# Patient Record
Sex: Male | Born: 1975 | Race: Black or African American | Hispanic: No | Marital: Single | Smoking: Former smoker
Health system: Southern US, Community
[De-identification: ages and names within clinical notes are randomized; demographics above are authoritative.]

## PROBLEM LIST (undated history)

## (undated) DIAGNOSIS — F419 Anxiety disorder, unspecified: Secondary | ICD-10-CM

## (undated) DIAGNOSIS — E119 Type 2 diabetes mellitus without complications: Secondary | ICD-10-CM

## (undated) DIAGNOSIS — I10 Essential (primary) hypertension: Secondary | ICD-10-CM

## (undated) DIAGNOSIS — E785 Hyperlipidemia, unspecified: Secondary | ICD-10-CM

## (undated) DIAGNOSIS — T7840XA Allergy, unspecified, initial encounter: Secondary | ICD-10-CM

## (undated) DIAGNOSIS — M1990 Unspecified osteoarthritis, unspecified site: Secondary | ICD-10-CM

## (undated) DIAGNOSIS — J302 Other seasonal allergic rhinitis: Secondary | ICD-10-CM

## (undated) HISTORY — DX: Hyperlipidemia, unspecified: E78.5

## (undated) HISTORY — DX: Type 2 diabetes mellitus without complications: E11.9

## (undated) HISTORY — DX: Allergy, unspecified, initial encounter: T78.40XA

## (undated) HISTORY — DX: Essential (primary) hypertension: I10

## (undated) HISTORY — DX: Anxiety disorder, unspecified: F41.9

## (undated) HISTORY — PX: TESTICLE SURGERY: SHX794

---

## 1997-12-13 ENCOUNTER — Emergency Department (HOSPITAL_COMMUNITY): Admission: EM | Admit: 1997-12-13 | Discharge: 1997-12-13 | Payer: Self-pay | Admitting: Emergency Medicine

## 2003-05-18 ENCOUNTER — Emergency Department (HOSPITAL_COMMUNITY): Admission: AD | Admit: 2003-05-18 | Discharge: 2003-05-18 | Payer: Self-pay | Admitting: Family Medicine

## 2003-08-18 ENCOUNTER — Emergency Department (HOSPITAL_COMMUNITY): Admission: AD | Admit: 2003-08-18 | Discharge: 2003-08-18 | Payer: Self-pay | Admitting: Emergency Medicine

## 2004-07-31 ENCOUNTER — Encounter: Admission: RE | Admit: 2004-07-31 | Discharge: 2004-07-31 | Payer: Self-pay | Admitting: Nephrology

## 2006-05-12 ENCOUNTER — Encounter: Admission: RE | Admit: 2006-05-12 | Discharge: 2006-05-12 | Payer: Self-pay | Admitting: Nephrology

## 2006-10-17 ENCOUNTER — Emergency Department (HOSPITAL_COMMUNITY): Admission: EM | Admit: 2006-10-17 | Discharge: 2006-10-17 | Payer: Self-pay | Admitting: Emergency Medicine

## 2006-12-08 ENCOUNTER — Encounter: Admission: RE | Admit: 2006-12-08 | Discharge: 2006-12-08 | Payer: Self-pay | Admitting: Nephrology

## 2008-06-20 ENCOUNTER — Emergency Department (HOSPITAL_COMMUNITY): Admission: EM | Admit: 2008-06-20 | Discharge: 2008-06-20 | Payer: Self-pay | Admitting: Family Medicine

## 2012-02-19 ENCOUNTER — Emergency Department (INDEPENDENT_AMBULATORY_CARE_PROVIDER_SITE_OTHER)
Admission: EM | Admit: 2012-02-19 | Discharge: 2012-02-19 | Disposition: A | Discharge status: Home / Self Care | Payer: Medicare Other | Source: Home / Self Care | Attending: Emergency Medicine | Admitting: Emergency Medicine

## 2012-02-19 ENCOUNTER — Encounter (HOSPITAL_COMMUNITY): Payer: Self-pay

## 2012-02-19 DIAGNOSIS — R0789 Other chest pain: Secondary | ICD-10-CM

## 2012-02-19 DIAGNOSIS — M25539 Pain in unspecified wrist: Secondary | ICD-10-CM

## 2012-02-19 HISTORY — DX: Unspecified osteoarthritis, unspecified site: M19.90

## 2012-02-19 HISTORY — DX: Other seasonal allergic rhinitis: J30.2

## 2012-02-19 MED ORDER — NAPROXEN 500 MG PO TABS: 500.0000 mg | ORAL_TABLET | Freq: Two times a day (BID) | ORAL | Status: AC

## 2012-02-19 NOTE — ED Provider Notes (Signed)
History     CSN: [id number]  Arrival date & time 02/19/12  1404   First MD Initiated Contact with Patient 02/19/12 1440      Chief Complaint  Patient presents with  . Muscle Pain    (Consider location/radiation/quality/duration/timing/severity/associated sxs/prior treatment) HPI Comments: Patient presents with 2 complaints: First, patient reports right-sided anterior chest soreness, tightness for 3 days. No palpitations, diaphoresis, nausea, vomiting. No exertional or positional component. States it is worse with torso rotation, movement of his right arm. Denies any trauma to the chest, recent change in physical activity. No recent immobilization, prolonged trip, surgery, no hemoptysis, no exogenous estrogen, no history of DVT or PE  Second, patient reports 3 months of left wrist pain worse with wrist flexion/extension, aggravated with lifting heavy objects. No recent or remote history of trauma to his wrist. No erythema, swelling, distal paresthesias. He is right-handed. Patient states he quit smoking 2 years ago.  ROS as noted in HPI. All other ROS negative.   Patient is a 36 y.o. male presenting with chest pain and wrist pain. The history is provided by the patient. No language interpreter was used.  Chest Pain The chest pain began 3 - 5 days ago. Chest pain occurs intermittently. The chest pain is unchanged. The pain is associated with breathing and coughing. The quality of the pain is described as tightness. The pain does not radiate. Chest pain is worsened by certain positions. Pertinent negatives for primary symptoms include no fever, no fatigue, no syncope, no cough, no wheezing, no palpitations, no nausea, no vomiting and no altered mental status.  Pertinent negatives for associated symptoms include no lower extremity edema and no near-syncope. He tried nothing for the symptoms. Risk factors include male gender.  Pertinent negatives for past medical history include no arrhythmia,  no cancer, no COPD, no diabetes, no DVT, no hyperlipidemia, no hypertension, no MI, no PE, no PVD and no recent injury.  Pertinent negatives for family medical history include: no CAD in family, no early MI in family and no PE in family.    Wrist Pain This is a chronic problem. The current episode started more than 1 week ago. The problem occurs daily. The problem has not changed since onset.Exacerbated by: use. Nothing relieves the symptoms. He has tried nothing for the symptoms. The treatment provided no relief.    Past Medical History  Diagnosis Date  . Seasonal allergies   . Arthritis     History reviewed. No pertinent past surgical history.  Family History  Problem Relation Age of Onset  . Rashes / Skin problems Other     History  Substance Use Topics  . Smoking status: Former Games developer  . Smokeless tobacco: Not on file  . Alcohol Use: No      Review of Systems  Constitutional: Negative for fever and fatigue.  Respiratory: Negative for cough and wheezing.   Cardiovascular: Negative for palpitations, syncope and near-syncope.  Gastrointestinal: Negative for nausea and vomiting.  Psychiatric/Behavioral: Negative for altered mental status.    Allergies  Review of patient's allergies indicates no known allergies.  Home Medications   Current Outpatient Rx  Name Route Sig Dispense Refill  . ACETAMINOPHEN 500 MG PO TABS Oral Take 500 mg by mouth every 6 (six) hours as needed.    Marland Kitchen LORATADINE 10 MG PO TABS Oral Take 10 mg by mouth daily.    Marland Kitchen NAPROXEN 500 MG PO TABS Oral Take 1 tablet (500 mg total) by mouth 2 (two)  times daily. 20 tablet 0    BP 116/80  Pulse 78  Temp 98.5 F (36.9 C) (Oral)  Resp 18  SpO2 98%  Physical Exam  Nursing note and vitals reviewed. Constitutional: He is oriented to person, place, and time. He appears well-developed and well-nourished.  HENT:  Head: Normocephalic and atraumatic.  Eyes: Conjunctivae normal and EOM are normal.  Neck:  Normal range of motion.  Cardiovascular: Normal rate, regular rhythm, normal heart sounds and intact distal pulses.  Exam reveals no gallop and no friction rub.   No murmur heard. Pulmonary/Chest: Effort normal and breath sounds normal. He exhibits tenderness.       reproducible tenderness right upper chest, aggravated with rowing motion, torso rotation, forward flexion of arm against resistance. No rash, crepitus.  Abdominal: Soft. Bowel sounds are normal. There is no guarding.  Musculoskeletal: Normal range of motion. He exhibits no edema and no tenderness.       Left wrist: He exhibits tenderness. He exhibits normal range of motion, no bony tenderness, no swelling, no effusion, no crepitus and no deformity.       Left hand: Normal.       Calves symmetric. Tenderness along dorsum of the wrist. No erythema, swelling. Snuffbox nontender, carpals, metacarpals nontender. Finkelstein negative. Sensation and motor intact in median/radial/ulnar distribution. RP 2+  Neurological: He is alert and oriented to person, place, and time. Coordination normal.  Skin: Skin is warm and dry. No rash noted.  Psychiatric: He has a normal mood and affect. His behavior is normal. Judgment and thought content normal.    ED Course  Procedures (including critical care time)  Labs Reviewed - No data to display No results found.   1. Wrist pain   2. Musculoskeletal chest pain     MDM  EKG: Sinus arrhythmia, rate 75. Left axis deviation. Normal intervals. No hypertrophy. No ST T-wave changes. No previous EKG for comparison  Rhythm strip, normal sinus rhythm, rate 70.  Presentation is consistent with musculoskeletal chest pain. Pt PERC negative.  Vitals are normal, EKG without ischemic changes, lungs clear. Has reproducible chest wall tenderness, which is aggravated with arm movement, torso rotation. There is no rash, bruising, deformity. He is neurovascularly intact distally. Referring to cardiology for abnormal  EKG.  Also suspect repetitive injury wrist, no signs of fracture, infection. Deferring x-ray. Will place him in a wrist splint, home with ice, rest, elevation, NSAIDs, which also help with this chest pain. Referring to Dr. Melvyn Novas, hand surgeon on call, if no improvement with conservative treatment in 2 weeks.  Luiz Blare, MD 02/24/12 [id number]

## 2012-02-19 NOTE — ED Notes (Signed)
C/o pain in left wrist, worse w motion for past few months, and pain in right chest  (worse w cough , movement or palpation of chest wall ) NAD

## 2015-01-19 ENCOUNTER — Ambulatory Visit (INDEPENDENT_AMBULATORY_CARE_PROVIDER_SITE_OTHER): Payer: Medicare Other | Admitting: Podiatry

## 2015-01-19 ENCOUNTER — Encounter: Payer: Self-pay | Admitting: Podiatry

## 2015-01-19 VITALS — BP 150/97 | HR 89 | Temp 98.2°F | Resp 14

## 2015-01-19 DIAGNOSIS — M722 Plantar fascial fibromatosis: Secondary | ICD-10-CM | POA: Diagnosis not present

## 2015-01-19 NOTE — Progress Notes (Signed)
   Subjective:    Patient ID: John Mckenzie, male    DOB: 1975-07-11, 39 y.o.   MRN: [id number]  HPI Patient presents here for B/L arch pain, sometimes hurting when walking. This patient presents to the office stating he has lived with arch pain for over six months.  He says his pain is severe at times he needs to limp.  He points to the center of his arch both feet as the painful areas. He presents for evaluation and treatment. Review of Systems  Respiratory: Positive for cough.   Cardiovascular: Positive for chest pain.  Musculoskeletal: Positive for back pain.       Objective:   Physical Exam GENERAL APPEARANCE: Alert, conversant. Appropriately groomed. No acute distress.  VASCULAR: Pedal pulses palpable at  [facility] and PT bilateral.  Capillary refill time is immediate to all digits,  Normal temperature gradient.  Digital hair growth is present bilateral  NEUROLOGIC: sensation is normal to 5.07 monofilament at 5/5 sites bilateral.  Light touch is intact bilateral, Muscle strength normal.  MUSCULOSKELETAL: acceptable muscle strength, tone and stability bilateral.  Intrinsic muscluature intact bilateral.  Rectus appearance of foot and digits noted bilateral. Palpable pain mid-arch both feet.  Flexible pes planus both feet.  DERMATOLOGIC: skin color, texture, and turgor are within normal limits.  No preulcerative lesions or ulcers  are seen, no interdigital maceration noted.  No open lesions present.  Digital nails are asymptomatic. No drainage noted.        Assessment & Plan:  Plantar fasciitis B/L.  IE>  Dispense purestride insoles.

## 2015-03-15 ENCOUNTER — Encounter (HOSPITAL_COMMUNITY): Payer: Self-pay | Admitting: Neurology

## 2015-03-15 ENCOUNTER — Emergency Department (HOSPITAL_COMMUNITY)
Admission: EM | Admit: 2015-03-15 | Discharge: 2015-03-15 | Disposition: A | Discharge status: Home / Self Care | Payer: Medicare Other | Attending: Emergency Medicine | Admitting: Emergency Medicine

## 2015-03-15 DIAGNOSIS — E119 Type 2 diabetes mellitus without complications: Secondary | ICD-10-CM | POA: Insufficient documentation

## 2015-03-15 DIAGNOSIS — Z87891 Personal history of nicotine dependence: Secondary | ICD-10-CM | POA: Insufficient documentation

## 2015-03-15 DIAGNOSIS — M1990 Unspecified osteoarthritis, unspecified site: Secondary | ICD-10-CM | POA: Diagnosis not present

## 2015-03-15 DIAGNOSIS — Z79899 Other long term (current) drug therapy: Secondary | ICD-10-CM | POA: Diagnosis not present

## 2015-03-15 DIAGNOSIS — I10 Essential (primary) hypertension: Secondary | ICD-10-CM | POA: Diagnosis not present

## 2015-03-15 DIAGNOSIS — K625 Hemorrhage of anus and rectum: Secondary | ICD-10-CM | POA: Diagnosis not present

## 2015-03-15 LAB — CBC
HEMATOCRIT: 46.8 % (ref 39.0–52.0)
Hemoglobin: 15.7 g/dL (ref 13.0–17.0)
MCH: 27.3 pg (ref 26.0–34.0)
MCHC: 33.5 g/dL (ref 30.0–36.0)
MCV: 81.4 fL (ref 78.0–100.0)
Platelets: 284 10*3/uL (ref 150–400)
RBC: 5.75 MIL/uL (ref 4.22–5.81)
RDW: 13.1 % (ref 11.5–15.5)
WBC: 10.4 10*3/uL (ref 4.0–10.5)

## 2015-03-15 LAB — COMPREHENSIVE METABOLIC PANEL
ALBUMIN: 4.2 g/dL (ref 3.5–5.0)
ALK PHOS: 126 U/L (ref 38–126)
ALT: 88 U/L — ABNORMAL HIGH (ref 17–63)
AST: 67 U/L — AB (ref 15–41)
Anion gap: 11 (ref 5–15)
BILIRUBIN TOTAL: 0.7 mg/dL (ref 0.3–1.2)
BUN: 11 mg/dL (ref 6–20)
CALCIUM: 9.6 mg/dL (ref 8.9–10.3)
CO2: 24 mmol/L (ref 22–32)
Chloride: 100 mmol/L — ABNORMAL LOW (ref 101–111)
Creatinine, Ser: 0.93 mg/dL (ref 0.61–1.24)
GFR calc Af Amer: >60 mL/min (ref >60)
GFR calc non Af Amer: >60 mL/min (ref >60)
GLUCOSE: 144 mg/dL — AB (ref 65–99)
POTASSIUM: 3.9 mmol/L (ref 3.5–5.1)
SODIUM: 135 mmol/L (ref 135–145)
TOTAL PROTEIN: 7 g/dL (ref 6.5–8.1)

## 2015-03-15 LAB — POC OCCULT BLOOD, ED: Fecal Occult Bld: POSITIVE — AB

## 2015-03-15 LAB — TYPE AND SCREEN
ABO/RH(D): O POS
ANTIBODY SCREEN: NEGATIVE

## 2015-03-15 LAB — ABO/RH: ABO/RH(D): O POS

## 2015-03-15 NOTE — Discharge Instructions (Signed)
It was our pleasure to provide your ER care today - we hope that you feel better.  From today's labs, your blood work/blood count is normal.  For episodes of rectal bleeding, follow up closely with GI specialist in the next couple days - see referral - call office tomorrow morning to arrange appointment.  Return to ER if worse, new symptoms, fevers, severe abdominal pain, recurrent/heavy bleeding, weak/fainting, vomiting of blood, other concern.      Gastrointestinal Bleeding Gastrointestinal (GI) bleeding means there is bleeding somewhere along the digestive tract, between the mouth and anus. CAUSES  There are many different problems that can cause GI bleeding. Possible causes include:  Esophagitis. This is inflammation, irritation, or swelling of the esophagus.  Hemorrhoids.These are veins that are full of blood (engorged) in the rectum. They cause pain, inflammation, and may bleed.  Anal fissures.These are areas of painful tearing which may bleed. They are often caused by passing hard stool.  Diverticulosis.These are pouches that form on the colon over time, with age, and may bleed significantly.  Diverticulitis.This is inflammation in areas with diverticulosis. It can cause pain, fever, and bloody stools, although bleeding is rare.  Polyps and cancer. Colon cancer often starts out as precancerous polyps.  Gastritis and ulcers.Bleeding from the upper gastrointestinal tract (near the stomach) may travel through the intestines and produce black, sometimes tarry, often bad smelling stools. In certain cases, if the bleeding is fast enough, the stools may not be black, but red. This condition may be life-threatening. SYMPTOMS   Vomiting bright red blood or material that looks like coffee grounds.  Bloody, black, or tarry stools. DIAGNOSIS  Your caregiver may diagnose your condition by taking your history and performing a physical exam. More tests may be needed,  including:  X-rays and other imaging tests.  Esophagogastroduodenoscopy (EGD). This test uses a flexible, lighted tube to look at your esophagus, stomach, and small intestine.  Colonoscopy. This test uses a flexible, lighted tube to look at your colon. TREATMENT  Treatment depends on the cause of your bleeding.   For bleeding from the esophagus, stomach, small intestine, or colon, the caregiver doing your EGD or colonoscopy may be able to stop the bleeding as part of the procedure.  Inflammation or infection of the colon can be treated with medicines.  Many rectal problems can be treated with creams, suppositories, or warm baths.  Surgery is sometimes needed.  Blood transfusions are sometimes needed if you have lost a lot of blood. If bleeding is slow, you may be allowed to go home. If there is a lot of bleeding, you will need to stay in the hospital for observation. HOME CARE INSTRUCTIONS   Take any medicines exactly as prescribed.  Keep your stools soft by eating foods that are high in fiber. These foods include whole grains, legumes, fruits, and vegetables. Prunes (1 to 3 a day) work well for many people.  Drink enough fluids to keep your urine clear or pale yellow. SEEK IMMEDIATE MEDICAL CARE IF:   Your bleeding increases.  You feel lightheaded, weak, or you faint.  You have severe cramps in your back or abdomen.  You pass large blood clots in your stool.  Your problems are getting worse. MAKE SURE YOU:   Understand these instructions.  Will watch your condition.  Will get help right away if you are not doing well or get worse.   This information is not intended to replace advice given to you by your health  care provider. Make sure you discuss any questions you have with your health care provider.   Document Released: 05/24/2000 Document Revised: 05/13/2012 Document Reviewed: 11/14/2014 Elsevier Interactive Patient Education 2016 [employer]  Anal Fissure,  Adult An anal fissure is a small tear or crack in the skin around the anus. Bleeding from a fissure usually stops on its own within a few minutes. However, bleeding will often occur again with each bowel movement until the crack heals. CAUSES This condition may be caused by:  Passing large, hard stool (feces).  Frequent diarrhea.  Constipation.  Inflammatory bowel disease (Crohn disease or ulcerative colitis).  Infections.  Anal sex. SYMPTOMS Symptoms of this condition include:  Bleeding from the rectum.  Small amounts of blood seen on your stool, on toilet paper, or in the toilet after a bowel movement.  Painful bowel movements.  Itching or irritation around the anus. DIAGNOSIS A health care provider may diagnose this condition by closely examining the anal area. An anal fissure can usually be seen with careful inspection. In some cases, a rectal exam may be performed, or a short tube (anoscope) may be used to examine the anal canal. TREATMENT Treatment for this condition may include:  Taking steps to avoid constipation. This may include making changes to your diet, such as increasing your intake of fiber or fluid.  Taking fiber supplements. These supplements can soften your stool to help make bowel movements easier. Your health care provider may also prescribe a stool softener if your stool is often hard.  Taking sitz baths. This may help to heal the tear.  Using medicated creams or ointments. These may be prescribed to lessen discomfort. HOME CARE INSTRUCTIONS Eating and Drinking  Avoid foods that may be constipating, such as bananas and dairy products.  Drink enough fluid to keep your urine clear or pale yellow.  Maintain a diet that is high in fruits, whole grains, and vegetables. General Instructions  Keep the anal area as clean and dry as possible.  Take sitz baths as told by your health care provider. Do not use soap in the sitz baths.  Take  over-the-counter and prescription medicines only as told by your health care provider.  Use creams or ointments only as told by your health care provider.  Keep all follow-up visits as told by your health care provider. This is important. SEEK MEDICAL CARE IF:  You have more bleeding.  You have a fever.  You have diarrhea that is mixed with blood.  You continue to have pain.  Your problem is getting worse rather than better.   This information is not intended to replace advice given to you by your health care provider. Make sure you discuss any questions you have with your health care provider.   Document Released: 05/27/2005 Document Revised: 02/15/2015 Document Reviewed: 08/22/2014 Elsevier Interactive Patient Education [employer]

## 2015-03-15 NOTE — ED Provider Notes (Signed)
CSN: [id number]     Arrival date & time 03/15/15  1714 History   First MD Initiated Contact with Patient 03/15/15 2002     Chief Complaint  Patient presents with  . Rectal Bleeding     (Consider location/radiation/quality/duration/timing/severity/associated sxs/prior Treatment) Patient is a 39 y.o. male presenting with hematochezia. The history is provided by the patient.  Rectal Bleeding Associated symptoms: no abdominal pain, no epistaxis, no fever, no light-headedness and no vomiting   Patient c/o 3-4 episodes bright red per rectum.  States was reddish brown, watery. Denies straining or hard stools. No hx significant gib in past. No rectal pain. No hx hemorrhoids or fissure. No vomiting. No hx pud. No hx liver disease. No anticoag use. No etoh. No lightheadedness or faintness. No abd pain. No other abn bruising or bleeding.     Past Medical History  Diagnosis Date  . Seasonal allergies   . Arthritis   . Diabetes mellitus without complication (HCC)   . Hypertension    History reviewed. No pertinent past surgical history. Family History  Problem Relation Age of Onset  . Rashes / Skin problems Other    Social History  Substance Use Topics  . Smoking status: Former Games developer  . Smokeless tobacco: None  . Alcohol Use: No    Review of Systems  Constitutional: Negative for fever and chills.  HENT: Negative for nosebleeds.   Eyes: Negative for redness.  Respiratory: Negative for shortness of breath.   Cardiovascular: Negative for chest pain.  Gastrointestinal: Positive for blood in stool and hematochezia. Negative for vomiting and abdominal pain.  Genitourinary: Negative for hematuria.  Musculoskeletal: Negative for back pain and neck pain.  Skin: Negative for rash.  Neurological: Negative for syncope, light-headedness and headaches.  Hematological: Does not bruise/bleed easily.  Psychiatric/Behavioral: Negative for confusion.      Allergies  Review of patient's  allergies indicates no known allergies.  Home Medications   Prior to Admission medications   Medication Sig Start Date End Date Taking? Authorizing Provider  acetaminophen (TYLENOL) 500 MG tablet Take 500 mg by mouth every 6 (six) hours as needed for mild pain or headache.    Yes Historical Provider, MD  atorvastatin (LIPITOR) 10 MG tablet Take 10 mg by mouth daily. 01/17/15  Yes Historical Provider, MD  INVOKANA 100 MG TABS tablet Take 100 mg by mouth daily. 01/17/15  Yes Historical Provider, MD  loratadine (CLARITIN) 10 MG tablet Take 10 mg by mouth daily.   Yes Historical Provider, MD  metFORMIN (GLUCOPHAGE) 1000 MG tablet 1 TABLET BY MOUTH TWICE A DAY 11/25/14  Yes Historical Provider, MD  omeprazole (PRILOSEC) 20 MG capsule Take 40 mg by mouth daily. 01/17/15  Yes Historical Provider, MD   BP 132/89 mmHg  Pulse 94  Temp(Src) 98.9 F (37.2 C) (Oral)  Resp 16  SpO2 99% Physical Exam  Constitutional: He is oriented to person, place, and time. He appears well-developed and well-nourished. No distress.  HENT:  Head: Atraumatic.  Eyes: Conjunctivae are normal. No scleral icterus.  Neck: Neck supple. No tracheal deviation present.  Cardiovascular: Normal rate, regular rhythm, normal heart sounds and intact distal pulses.   Pulmonary/Chest: Effort normal and breath sounds normal. No accessory muscle usage. No respiratory distress.  Abdominal: Soft. Bowel sounds are normal. He exhibits no distension. There is no tenderness. There is no rebound and no guarding.  Genitourinary:  No formed stool on rectal, scant brownish mucous, tests heme pos.   Musculoskeletal: Normal range of  motion. He exhibits no edema or tenderness.  Neurological: He is alert and oriented to person, place, and time.  Skin: Skin is warm and dry. No rash noted.  Psychiatric: He has a normal mood and affect.  Nursing note and vitals reviewed.   ED Course  Procedures (including critical care time) Labs Review  Results  for orders placed or performed during the hospital encounter of 03/15/15  Comprehensive metabolic panel  Result Value Ref Range   Sodium 135 135 - 145 mmol/L   Potassium 3.9 3.5 - 5.1 mmol/L   Chloride 100 (L) 101 - 111 mmol/L   CO2 24 22 - 32 mmol/L   Glucose, Bld 144 (H) 65 - 99 mg/dL   BUN 11 6 - 20 mg/dL   Creatinine, Ser 4.09 0.61 - 1.24 mg/dL   Calcium 9.6 8.9 - 81.1 mg/dL   Total Protein 7.0 6.5 - 8.1 g/dL   Albumin 4.2 3.5 - 5.0 g/dL   AST 67 (H) 15 - 41 U/L   ALT 88 (H) 17 - 63 U/L   Alkaline Phosphatase 126 38 - 126 U/L   Total Bilirubin 0.7 0.3 - 1.2 mg/dL   GFR calc non Af Amer >60 >60 mL/min   GFR calc Af Amer >60 >60 mL/min   Anion gap 11 5 - 15  CBC  Result Value Ref Range   WBC 10.4 4.0 - 10.5 K/uL   RBC 5.75 4.22 - 5.81 MIL/uL   Hemoglobin 15.7 13.0 - 17.0 g/dL   HCT 91.4 78.2 - 95.6 %   MCV 81.4 78.0 - 100.0 fL   MCH 27.3 26.0 - 34.0 pg   MCHC 33.5 30.0 - 36.0 g/dL   RDW 21.3 08.6 - 57.8 %   Platelets 284 150 - 400 K/uL  Type and screen  Result Value Ref Range   ABO/RH(D) O POS    Antibody Screen NEG    Sample Expiration 03/18/2015   ABO/Rh  Result Value Ref Range   ABO/RH(D) O POS       I have personally reviewed and evaluated these lab results as part of my medical decision-making.    MDM   Labs.  Reviewed nursing notes and prior charts for additional history.   No faintness or dizziness. Vitals normal. Hgb/bun normal.  Discussed diff dx w pt.  Pt had picture of home bms, appearred to have small amt brownish-reddish liquid in toilet bowl full of water. No melena.  Given reassuring labs and exam, feel pt currently stable for d/c.   Close gi f/u.       Cathren Laine, MD 03/15/15 2049

## 2015-03-15 NOTE — ED Notes (Signed)
Pt reports rectal bleeding x 4 today when having a BM. Blood is bright red, this has happened once in past but went away on its own. Denies blood thinners. Pt is a x 4.

## 2015-09-21 ENCOUNTER — Encounter (HOSPITAL_COMMUNITY): Payer: Self-pay

## 2015-09-21 ENCOUNTER — Emergency Department (HOSPITAL_COMMUNITY)
Admission: EM | Admit: 2015-09-21 | Discharge: 2015-09-21 | Disposition: A | Discharge status: Home / Self Care | Payer: Medicare Other | Attending: Emergency Medicine | Admitting: Emergency Medicine

## 2015-09-21 DIAGNOSIS — R739 Hyperglycemia, unspecified: Secondary | ICD-10-CM

## 2015-09-21 DIAGNOSIS — Z87891 Personal history of nicotine dependence: Secondary | ICD-10-CM | POA: Diagnosis not present

## 2015-09-21 DIAGNOSIS — M1990 Unspecified osteoarthritis, unspecified site: Secondary | ICD-10-CM | POA: Diagnosis not present

## 2015-09-21 DIAGNOSIS — R630 Anorexia: Secondary | ICD-10-CM | POA: Insufficient documentation

## 2015-09-21 DIAGNOSIS — I10 Essential (primary) hypertension: Secondary | ICD-10-CM | POA: Insufficient documentation

## 2015-09-21 DIAGNOSIS — Z79899 Other long term (current) drug therapy: Secondary | ICD-10-CM | POA: Insufficient documentation

## 2015-09-21 DIAGNOSIS — Z7984 Long term (current) use of oral hypoglycemic drugs: Secondary | ICD-10-CM | POA: Diagnosis not present

## 2015-09-21 DIAGNOSIS — E1165 Type 2 diabetes mellitus with hyperglycemia: Secondary | ICD-10-CM | POA: Insufficient documentation

## 2015-09-21 LAB — BASIC METABOLIC PANEL
Anion gap: 13 (ref 5–15)
BUN: 8 mg/dL (ref 6–20)
CALCIUM: 9 mg/dL (ref 8.9–10.3)
CO2: 19 mmol/L — AB (ref 22–32)
Chloride: 107 mmol/L (ref 101–111)
Creatinine, Ser: 0.87 mg/dL (ref 0.61–1.24)
GFR calc Af Amer: >60 mL/min (ref >60)
GLUCOSE: 372 mg/dL — AB (ref 65–99)
POTASSIUM: 4.6 mmol/L (ref 3.5–5.1)
Sodium: 139 mmol/L (ref 135–145)

## 2015-09-21 LAB — URINALYSIS, ROUTINE W REFLEX MICROSCOPIC
Bilirubin Urine: NEGATIVE
Hgb urine dipstick: NEGATIVE
KETONES UR: NEGATIVE mg/dL
LEUKOCYTES UA: NEGATIVE
Nitrite: NEGATIVE
PROTEIN: NEGATIVE mg/dL
Specific Gravity, Urine: 1.038 — ABNORMAL HIGH (ref 1.005–1.030)
pH: 6 (ref 5.0–8.0)

## 2015-09-21 LAB — I-STAT TROPONIN, ED: TROPONIN I, POC: 0 ng/mL (ref 0.00–0.08)

## 2015-09-21 LAB — CBC
HCT: 45.3 % (ref 39.0–52.0)
Hemoglobin: 15.2 g/dL (ref 13.0–17.0)
MCH: 26.8 pg (ref 26.0–34.0)
MCHC: 33.6 g/dL (ref 30.0–36.0)
MCV: 79.8 fL (ref 78.0–100.0)
PLATELETS: 244 10*3/uL (ref 150–400)
RBC: 5.68 MIL/uL (ref 4.22–5.81)
RDW: 13.9 % (ref 11.5–15.5)
WBC: 8.6 10*3/uL (ref 4.0–10.5)

## 2015-09-21 LAB — CBG MONITORING, ED
GLUCOSE-CAPILLARY: 360 mg/dL — AB (ref 65–99)
Glucose-Capillary: 222 mg/dL — ABNORMAL HIGH (ref 65–99)

## 2015-09-21 LAB — URINE MICROSCOPIC-ADD ON
Bacteria, UA: NONE SEEN
RBC / HPF: NONE SEEN RBC/hpf (ref 0–5)

## 2015-09-21 MED ORDER — ONDANSETRON HCL 4 MG PO TABS: 4.0000 mg | ORAL_TABLET | Freq: Four times a day (QID) | ORAL | Status: DC

## 2015-09-21 MED ORDER — ONDANSETRON HCL 4 MG/2ML IJ SOLN
4.0000 mg | Freq: Once | INTRAMUSCULAR | Status: AC
  Administered 2015-09-21: 4 mg via INTRAVENOUS
  Filled 2015-09-21: qty 2

## 2015-09-21 MED ORDER — SODIUM CHLORIDE 0.9 % IV BOLUS (SEPSIS)
1000.0000 mL | Freq: Once | INTRAVENOUS | Status: AC
  Administered 2015-09-21: 1000 mL via INTRAVENOUS

## 2015-09-21 NOTE — ED Notes (Signed)
Pt states understanding of all discharge orders.

## 2015-09-21 NOTE — ED Provider Notes (Signed)
CSN: [id number]     Arrival date & time 09/21/15  1415 History   First MD Initiated Contact with Patient 09/21/15 1930     Chief Complaint  Patient presents with  . Hyperglycemia     (Consider location/radiation/quality/duration/timing/severity/associated sxs/prior Treatment) Patient is a 40 y.o. male presenting with hyperglycemia. The history is provided by the patient.  Hyperglycemia Blood sugar level PTA:  >400 Severity:  Severe Onset quality:  Gradual Duration:  1 day Timing:  Constant Progression:  Waxing and waning Chronicity:  Chronic Diabetes status:  Controlled with oral medications Current diabetic therapy:  Metformin Time since last antidiabetic medication: this AM. Context: recent change in diet (poor dietary choices)   Context: not change in medication, not insulin pump use, not new diabetes diagnosis, not noncompliance and not recent illness   Relieved by:  None tried Ineffective treatments:  Diet and oral agents Associated symptoms: increased thirst, nausea and polyuria   Associated symptoms: no abdominal pain, no altered mental status, no chest pain, no confusion, no diaphoresis, no dizziness, no dysuria, no fatigue, no fever, no shortness of breath, no syncope, no vomiting and no weakness   Nausea:    Severity:  Mild   Onset quality:  Gradual   Progression:  Waxing and waning Risk factors: obesity   Risk factors: no hx of DKA     Past Medical History  Diagnosis Date  . Seasonal allergies   . Arthritis   . Diabetes mellitus without complication (HCC)   . Hypertension    History reviewed. No pertinent past surgical history. Family History  Problem Relation Age of Onset  . Rashes / Skin problems Other    Social History  Substance Use Topics  . Smoking status: Former Games developermoker  . Smokeless tobacco: None  . Alcohol Use: No    Review of Systems  Constitutional: Positive for activity change and appetite change. Negative for fever, diaphoresis and  fatigue.  HENT: Negative for congestion, rhinorrhea, sinus pressure, sneezing and sore throat.   Respiratory: Negative for chest tightness and shortness of breath.   Cardiovascular: Negative for chest pain and syncope.  Gastrointestinal: Positive for nausea. Negative for vomiting, abdominal pain and diarrhea.  Endocrine: Positive for polydipsia and polyuria.  Genitourinary: Positive for frequency. Negative for dysuria.  Musculoskeletal: Negative for myalgias, back pain and neck pain.  Skin: Negative for rash and wound.  Neurological: Negative for dizziness, syncope, weakness and headaches.  Psychiatric/Behavioral: Negative for confusion.  All other systems reviewed and are negative.     Allergies  Review of patient's allergies indicates no known allergies.  Home Medications   Prior to Admission medications   Medication Sig Start Date End Date Taking? Authorizing Provider  acetaminophen (TYLENOL) 500 MG tablet Take 500 mg by mouth every 6 (six) hours as needed for mild pain or headache.    Yes Historical Provider, MD  cyclobenzaprine (FLEXERIL) 10 MG tablet Take 10 mg by mouth 3 (three) times daily as needed for muscle spasms.   Yes Historical Provider, MD  loratadine (CLARITIN) 10 MG tablet Take 10 mg by mouth daily.   Yes Historical Provider, MD  meloxicam (MOBIC) 15 MG tablet Take 15 mg by mouth daily.   Yes Historical Provider, MD  metFORMIN (GLUCOPHAGE) 1000 MG tablet 1 TABLET BY MOUTH TWICE A DAY 11/25/14  Yes Historical Provider, MD  omeprazole (PRILOSEC) 20 MG capsule Take 40 mg by mouth daily. 01/17/15  Yes Historical Provider, MD  ondansetron (ZOFRAN) 4 MG tablet Take 1  tablet (4 mg total) by mouth every 6 (six) hours. 09/21/15   Francoise Ceo, DO   BP 98/57 mmHg  Pulse 76  Temp(Src) 98 F (36.7 C) (Oral)  Resp 16  SpO2 100% Physical Exam  Constitutional: He is oriented to person, place, and time. He appears well-developed and well-nourished. No distress.  HENT:  Head:  Normocephalic and atraumatic.  Right Ear: External ear normal.  Left Ear: External ear normal.  Nose: Nose normal.  Mouth/Throat: Oropharynx is clear and moist.  Eyes: Conjunctivae are normal. Pupils are equal, round, and reactive to light.  Neck: Neck supple.  Cardiovascular: Normal rate, regular rhythm, normal heart sounds and intact distal pulses.   Pulmonary/Chest: Effort normal and breath sounds normal.  Abdominal: Soft. He exhibits no distension. There is no tenderness.  Musculoskeletal: He exhibits no edema or tenderness.  Neurological: He is alert and oriented to person, place, and time. No cranial nerve deficit. Coordination normal.  Skin: Skin is warm and dry. No rash noted. He is not diaphoretic.  Nursing note and vitals reviewed.   ED Course  Procedures (including critical care time) Labs Review Labs Reviewed  BASIC METABOLIC PANEL - Abnormal; Notable for the following:    CO2 19 (*)    Glucose, Bld 372 (*)    All other components within normal limits  URINALYSIS, ROUTINE W REFLEX MICROSCOPIC (NOT AT [facility]) - Abnormal; Notable for the following:    Specific Gravity, Urine 1.038 (*)    Glucose, UA >1000 (*)    All other components within normal limits  URINE MICROSCOPIC-ADD ON - Abnormal; Notable for the following:    Squamous Epithelial / LPF 0-5 (*)    All other components within normal limits  CBG MONITORING, ED - Abnormal; Notable for the following:    Glucose-Capillary 360 (*)    All other components within normal limits  CBG MONITORING, ED - Abnormal; Notable for the following:    Glucose-Capillary 222 (*)    All other components within normal limits  CBC  I-STAT TROPOININ, ED    Imaging Review No results found. I have personally reviewed and evaluated these images and lab results as part of my medical decision-making.   EKG Interpretation None      MDM  40 y.o. male with a history of type 2 diabetes on metformin presents to the emergency department  noting recent polyuria or polydipsia and mild nausea and checked his glucose this afternoon and found it to be greater than 400. Physical exam, as above vital signs stable sitting in bed in no acute distress. Labs are drawn and returned very reassuringly with no leukocytosis nor anemia, mildly decreased bicarbonate at 19 but normal anion gap. Initial blood glucose was 360 and upon arrival to the room, found to be 222 with no other intervention.  EKG was done and showed NSR with no acute ST or t-wave abnormalities suggestive of ischemia. Troponin negative. He was given IV fluids and Zofran and had improvement in his symptoms. Feel that his symptoms are due to hyperglycemia, and I have low suspicion for infectious or ischemic etiology of his symptoms. Feel it has hyperglycemia is likely due to dietary noncompliance. He was strongly recommended to abide by a healthy diabetic diet and was recommended to follow up with his PCP within the next 3 days to further discuss his medication regimen. He was prescribed a few Zofran ODT for further symptomatic relief, as needed. This plan was discussed with the patient at the  bedside and he stated both understanding and agreement.   Final diagnoses:  Hyperglycemia      Francoise Ceo, DO 09/22/15 0134  Doug Sou, MD 09/23/15 [phone]

## 2015-09-21 NOTE — ED Provider Notes (Signed)
Complains of polyuria and polydipsia for 1 week.  today he has had nausea since approximate 10 AM he denies any chest pain denies fever denies shortness of breath.he does complain of generalized weakness. Feels dehydrated. On exam alert no distress mucous members are dry   Doug SouSam Selig [place], MD 09/22/15 0003

## 2015-09-21 NOTE — ED Notes (Signed)
Patient here with blood sugar running high, reports that it was greater than 400 at drug store. Has been taking metformin as prescribed. Reports frequent urination. No distress

## 2016-01-19 ENCOUNTER — Encounter: Payer: Medicare Other | Attending: Family | Admitting: Skilled Nursing Facility1

## 2016-01-19 ENCOUNTER — Encounter: Payer: Self-pay | Admitting: Skilled Nursing Facility1

## 2016-01-19 DIAGNOSIS — E119 Type 2 diabetes mellitus without complications: Secondary | ICD-10-CM | POA: Diagnosis not present

## 2016-01-19 DIAGNOSIS — E782 Mixed hyperlipidemia: Secondary | ICD-10-CM | POA: Diagnosis not present

## 2016-01-19 DIAGNOSIS — Z713 Dietary counseling and surveillance: Secondary | ICD-10-CM | POA: Diagnosis not present

## 2016-01-19 NOTE — Progress Notes (Signed)
Diabetes Self-Management Education  Visit Type: First/Initial  Appt. Start Time: 9:30 Appt. End Time: 11:00  01/19/2016  John Mckenzie, identified by name and date of birth, is a 40 y.o. male with a diagnosis of Diabetes: Type 2.   ASSESSMENT  Height  (1.803 m), weight 208 lb (94.3 kg). Body mass index is 29.01 kg/m. Pt accompanied by his mother and wife. Pt seemed to have some mental slowness but was able to understand the concepts educated on.      Diabetes Self-Management Education - 01/19/16 0924      Visit Information   Visit Type First/Initial     Initial Visit   Diabetes Type Type 2   Are you currently following a meal plan? No   Are you taking your medications as prescribed? No   Date Diagnosed 2015     Health Coping   How would you rate your overall health? Fair     Psychosocial Assessment   Patient Belief/Attitude about Diabetes Motivated to manage diabetes   Self-management support Family   Other persons present Spouse/SO;Family Member   Patient Concerns Nutrition/Meal planning;Glycemic Control;Weight Control   Special Needs Simplified materials     Pre-Education Assessment   Patient understands the diabetes disease and treatment process. Needs Instruction   Patient understands incorporating nutritional management into lifestyle. Needs Instruction   Patient undertands incorporating physical activity into lifestyle. Needs Instruction   Patient understands using medications safely. Needs Instruction   Patient understands monitoring blood glucose, interpreting and using results Needs Instruction   Patient understands prevention, detection, and treatment of acute complications. Needs Instruction   Patient understands prevention, detection, and treatment of chronic complications. Needs Instruction   Patient understands how to develop strategies to address psychosocial issues. Needs Instruction   Patient understands how to develop strategies to  promote health/change behavior. Needs Instruction     Complications   Last HgB A1C per patient/outside source 10.5 %   How often do you check your blood sugar? --  a few times a week   Fasting Blood glucose range (mg/dL) 16-109   Postprandial Blood glucose range (mg/dL) 60-454   Have you had a dilated eye exam in the past 12 months? Yes   Have you had a dental exam in the past 12 months? Yes   Are you checking your feet? No   How many days per week are you checking your feet? 5     Dietary Intake   Breakfast cereal----grits   Lunch sandwich   Dinner shake and bake chicken, vegetables   Beverage(s) milk, water, soda     Exercise   Exercise Type Light (walking / raking leaves)   How many days per week to you exercise? 5   How many minutes per day do you exercise? 30   Total minutes per week of exercise 150     Patient Education   Previous Diabetes Education No   Disease state  Definition of diabetes, type 1 and 2, and the diagnosis of diabetes;Factors that contribute to the development of diabetes   Nutrition management  Role of diet in the treatment of diabetes and the relationship between the three main macronutrients and blood glucose level;Food label reading, portion sizes and measuring food.;Carbohydrate counting;Reviewed blood glucose goals for pre and post meals and how to evaluate the patients' food intake on their blood glucose level.   Physical activity and exercise  Role of exercise on diabetes management, blood pressure control and cardiac health.;Helped patient  identify appropriate exercises in relation to his/her diabetes, diabetes complications and other health issue.;Identified with patient nutritional and/or medication changes necessary with exercise.   Monitoring Purpose and frequency of SMBG.;Yearly dilated eye exam;Daily foot exams;Taught/evaluated SMBG meter.;Taught/discussed recording of test results and interpretation of SMBG.   Acute complications Taught treatment  of hypoglycemia - the 15 rule.;Discussed and identified patients' treatment of hyperglycemia.   Chronic complications Dental care;Assessed and discussed foot care and prevention of foot problems   Psychosocial adjustment Role of stress on diabetes     Individualized Goals (developed by patient)   Nutrition Follow meal plan discussed;Adjust meds/carbs with exercise as discussed   Physical Activity Exercise 3-5 times per week;60 minutes per day   Medications take my medication as prescribed   Monitoring  test blood glucose pre and post meals as discussed;test my blood glucose as discussed     Post-Education Assessment   Patient understands the diabetes disease and treatment process. Demonstrates understanding / competency   Patient understands incorporating nutritional management into lifestyle. Demonstrates understanding / competency   Patient undertands incorporating physical activity into lifestyle. Demonstrates understanding / competency   Patient understands using medications safely. Demonstrates understanding / competency   Patient understands monitoring blood glucose, interpreting and using results Demonstrates understanding / competency   Patient understands prevention, detection, and treatment of acute complications. Demonstrates understanding / competency   Patient understands prevention, detection, and treatment of chronic complications. Demonstrates understanding / competency   Patient understands how to develop strategies to address psychosocial issues. Demonstrates understanding / competency   Patient understands how to develop strategies to promote health/change behavior. Demonstrates understanding / competency     Outcomes   Expected Outcomes Demonstrated interest in learning. Expect positive outcomes   Future DMSE PRN   Program Status Completed      Individualized Plan for Diabetes Self-Management Training:   Learning Objective:  Patient will have a greater understanding  of diabetes self-management. Patient education plan is to attend individual and/or group sessions per assessed needs and concerns.   Plan:   Patient Instructions  -Look for 0%, 1%, fat free, or skim milk -Always bring your meter with you everywhere you go -Always Properly dispose of your needles:  -Discard in a hard plastic/metal container with a lid (something the needle can't puncture)  -Write Do Not Recycle on the outside of the container  -Example: A laundry detergent bottle -Never use the same needle more than once -Eat 2-3 carbohydrate choices for each meal and 1 for each snack -A meal: carbohydrates, protein, vegetable -A snack: A Fruit OR Vegetable AND Protein  -Always pay attention to your body keeping watchful of possible low blood sugar (below 70) or high blood sugar (above 200)  -check your feet every day -Start walking at the gym 3 days a week for     Expected Outcomes:  Demonstrated interest in learning. Expect positive outcomes  Education material provided: Living Well with Diabetes, Meal plan card, My Plate and Snack sheet  If problems or questions, patient to contact team via:  Phone  Future DSME appointment: PRN

## 2016-01-19 NOTE — Patient Instructions (Addendum)
-  Look for 0%, 1%, fat free, or skim milk -Always bring your meter with you everywhere you go -Always Properly dispose of your needles:  -Discard in a hard plastic/metal container with a lid (something the needle can't puncture)  -Write Do Not Recycle on the outside of the container  -Example: A laundry detergent bottle -Never use the same needle more than once -Eat 2-3 carbohydrate choices for each meal and 1 for each snack -A meal: carbohydrates, protein, vegetable -A snack: A Fruit OR Vegetable AND Protein  -Always pay attention to your body keeping watchful of possible low blood sugar (below 70) or high blood sugar (above 200)  -check your feet every day -Start walking at the gym 3 days a week for

## 2016-01-26 ENCOUNTER — Encounter: Payer: Self-pay | Admitting: Skilled Nursing Facility1

## 2016-02-29 ENCOUNTER — Encounter (HOSPITAL_COMMUNITY): Payer: Self-pay | Admitting: *Deleted

## 2016-02-29 ENCOUNTER — Emergency Department (HOSPITAL_COMMUNITY): Payer: Medicare Other

## 2016-02-29 DIAGNOSIS — Z7984 Long term (current) use of oral hypoglycemic drugs: Secondary | ICD-10-CM | POA: Insufficient documentation

## 2016-02-29 DIAGNOSIS — E119 Type 2 diabetes mellitus without complications: Secondary | ICD-10-CM | POA: Insufficient documentation

## 2016-02-29 DIAGNOSIS — Z79899 Other long term (current) drug therapy: Secondary | ICD-10-CM | POA: Insufficient documentation

## 2016-02-29 DIAGNOSIS — I10 Essential (primary) hypertension: Secondary | ICD-10-CM | POA: Diagnosis not present

## 2016-02-29 DIAGNOSIS — Z87891 Personal history of nicotine dependence: Secondary | ICD-10-CM | POA: Insufficient documentation

## 2016-02-29 DIAGNOSIS — M79602 Pain in left arm: Secondary | ICD-10-CM | POA: Diagnosis present

## 2016-02-29 LAB — BASIC METABOLIC PANEL
Anion gap: 8 (ref 5–15)
BUN: 8 mg/dL (ref 6–20)
CHLORIDE: 103 mmol/L (ref 101–111)
CO2: 26 mmol/L (ref 22–32)
Calcium: 9.7 mg/dL (ref 8.9–10.3)
Creatinine, Ser: 1.01 mg/dL (ref 0.61–1.24)
Glucose, Bld: 167 mg/dL — ABNORMAL HIGH (ref 65–99)
POTASSIUM: 3.9 mmol/L (ref 3.5–5.1)
SODIUM: 137 mmol/L (ref 135–145)

## 2016-02-29 LAB — CBC
HCT: 49 % (ref 39.0–52.0)
Hemoglobin: 15.9 g/dL (ref 13.0–17.0)
MCH: 25.6 pg — ABNORMAL LOW (ref 26.0–34.0)
MCHC: 32.4 g/dL (ref 30.0–36.0)
MCV: 78.9 fL (ref 78.0–100.0)
PLATELETS: 259 10*3/uL (ref 150–400)
RBC: 6.21 MIL/uL — AB (ref 4.22–5.81)
RDW: 14.2 % (ref 11.5–15.5)
WBC: 10.4 10*3/uL (ref 4.0–10.5)

## 2016-02-29 LAB — I-STAT TROPONIN, ED: TROPONIN I, POC: 0 ng/mL (ref 0.00–0.08)

## 2016-02-29 NOTE — ED Triage Notes (Signed)
Pt c/o L arm pain x 1 month. Pain intermittently radiates into L chest. PCP sent pt to ED for further eval

## 2016-03-01 ENCOUNTER — Emergency Department (HOSPITAL_COMMUNITY)
Admission: EM | Admit: 2016-03-01 | Discharge: 2016-03-01 | Disposition: A | Discharge status: Home / Self Care | Payer: Medicare Other | Attending: Emergency Medicine | Admitting: Emergency Medicine

## 2016-03-01 DIAGNOSIS — M79602 Pain in left arm: Secondary | ICD-10-CM | POA: Diagnosis not present

## 2016-03-01 MED ORDER — LIDOCAINE 5 % EX PTCH
1.0000 | MEDICATED_PATCH | Freq: Once | CUTANEOUS | Status: DC
  Administered 2016-03-01: 1 via TRANSDERMAL
  Filled 2016-03-01: qty 1

## 2016-03-01 MED ORDER — ACETAMINOPHEN 500 MG PO TABS
1000.0000 mg | ORAL_TABLET | Freq: Once | ORAL | Status: AC
  Administered 2016-03-01: 1000 mg via ORAL
  Filled 2016-03-01: qty 2

## 2016-03-01 NOTE — ED Provider Notes (Signed)
[facility]-EMERGENCY DEPT Provider Note   CSN: [id number] Arrival date & time: 02/29/16  2047  By signing my name below, I, Suzan SlickAshley N. Elon SpannerLeger, attest that this documentation has been prepared under the direction and in the presence of Tomasita CrumbleAdeleke Zakar Brosch, MD.  Electronically Signed: Suzan SlickAshley N. Elon SpannerLeger, ED Scribe. 03/01/16. 2:40 AM.    History   Chief Complaint Chief Complaint  Patient presents with  . Arm Pain   The history is provided by the patient. No language interpreter was used.    HPI Comments: John Mckenzie is a 40 y.o. male with a PMHx of arthritis, DM, and HTN who presents to the Emergency Department complaining of constant, unchanged L arm pain x 1 month. Pain is described as throbbing. No recent injury or traumas. Pt states pain is worsened at night time without any alleviating factors. No OTC medications or home remedies attempted prior to arrival. However, pt takes Meloxicam daily. No recent fever, chills, nausea, or vomiting. Pt is due to follow up with his PCP later this month.  PCP: Pcp Not In System    Past Medical History:  Diagnosis Date  . Arthritis   . Diabetes mellitus without complication (HCC)   . Hypertension   . Seasonal allergies     There are no active problems to display for this patient.   History reviewed. No pertinent surgical history.     Home Medications    Prior to Admission medications   Medication Sig Start Date End Date Taking? Authorizing Provider  canagliflozin (INVOKANA) 300 MG TABS tablet Take 300 mg by mouth daily before breakfast.   Yes Historical Provider, MD  loratadine (CLARITIN) 10 MG tablet Take 10 mg by mouth daily.   Yes Historical Provider, MD  meloxicam (MOBIC) 15 MG tablet Take 15 mg by mouth daily.   Yes Historical Provider, MD  metFORMIN (GLUCOPHAGE) 1000 MG tablet 1 TABLET BY MOUTH TWICE A DAY 11/25/14  Yes Historical Provider, MD  omeprazole (PRILOSEC) 20 MG capsule Take 40 mg by mouth daily. 01/17/15  Yes Historical  Provider, MD  ondansetron (ZOFRAN) 4 MG tablet Take 1 tablet (4 mg total) by mouth every 6 (six) hours. Patient not taking: Reported on 03/01/2016 09/21/15   Francoise CeoWarren S Jones, DO    Family History Family History  Problem Relation Age of Onset  . Rashes / Skin problems Other   . Cancer Other   . Hypertension Other   . Hyperlipidemia Other     Social History Social History  Substance Use Topics  . Smoking status: Former Games developermoker  . Smokeless tobacco: Never Used  . Alcohol use No     Allergies   Review of patient's allergies indicates no known allergies.   Review of Systems Review of Systems  Constitutional: Negative for chills and fever.  Respiratory: Negative for shortness of breath.   Cardiovascular: Negative for chest pain.  Gastrointestinal: Negative for nausea and vomiting.  Musculoskeletal: Positive for arthralgias.  Neurological: Negative for numbness.  All other systems reviewed and are negative.    Physical Exam Updated Vital Signs BP 105/92   Pulse 89   Temp 98.6 F (37 C)   Resp 10   SpO2 97%   Physical Exam  Constitutional: He is oriented to person, place, and time. Vital signs are normal. He appears well-developed and well-nourished.  Non-toxic appearance. He does not appear ill. No distress.  HENT:  Head: Normocephalic and atraumatic.  Nose: Nose normal.  Mouth/Throat: Oropharynx is clear and moist.  No oropharyngeal exudate.  Eyes: Conjunctivae and EOM are normal. Pupils are equal, round, and reactive to light. No scleral icterus.  Neck: Normal range of motion. Neck supple. No tracheal deviation, no edema, no erythema and normal range of motion present. No thyroid mass and no thyromegaly present.  Cardiovascular: Normal rate, regular rhythm, S1 normal, S2 normal, normal heart sounds, intact distal pulses and normal pulses.  Exam reveals no gallop and no friction rub.   No murmur heard. Pulmonary/Chest: Effort normal and breath sounds normal. No  respiratory distress. He has no wheezes. He has no rhonchi. He has no rales.  Abdominal: Soft. Normal appearance and bowel sounds are normal. He exhibits no distension, no ascites and no mass. There is no hepatosplenomegaly. There is no tenderness. There is no rebound, no guarding and no CVA tenderness.  Musculoskeletal: Normal range of motion. He exhibits no edema or tenderness.  Normal ROM of the L upper arm  Lymphadenopathy:    He has no cervical adenopathy.  Neurological: He is alert and oriented to person, place, and time. He has normal strength. No cranial nerve deficit or sensory deficit.  Skin: Skin is warm, dry and intact. No petechiae and no rash noted. He is not diaphoretic. No erythema. No pallor.  Nursing note and vitals reviewed.    ED Treatments / Results   DIAGNOSTIC STUDIES: Oxygen Saturation is 99% on RA, Normal by my interpretation.    COORDINATION OF CARE: 2:27 AM-Will order blood work, EKG, and CXR. Discussed treatment plan with pt at bedside and pt agreed to plan.     Labs (all labs ordered are listed, but only abnormal results are displayed) Labs Reviewed  BASIC METABOLIC PANEL - Abnormal; Notable for the following:       Result Value   Glucose, Bld 167 (*)    All other components within normal limits  CBC - Abnormal; Notable for the following:    RBC 6.21 (*)    MCH 25.6 (*)    All other components within normal limits  I-STAT TROPOININ, ED    EKG  EKG Interpretation  Date/Time:  Thursday February 29 2016 20:52:10 EDT Ventricular Rate:  102 PR Interval:  122 QRS Duration: 88 QT Interval:  338 QTC Calculation: 440 R Axis:   -41 Text Interpretation:  Sinus tachycardia Left axis deviation Abnormal ECG No acute changes No significant change since last tracing Confirmed by Rhunette Croft, MD, Janey Genta [phone]) on 03/01/2016 12:16:49 AM       Radiology Dg Chest 2 View  Result Date: 02/29/2016 CLINICAL DATA:  40 y/o  M; left arm pain for 1 month. EXAM: CHEST   2 VIEW COMPARISON:  12/08/2006 chest radiograph. FINDINGS: Stable cardiomediastinal silhouette within normal limits. Mild rightward curvature of the lower thoracic spine. Clear lungs. No pneumothorax or pleural effusion. No significant interval change. IMPRESSION: No active cardiopulmonary disease. Electronically Signed   By: Mitzi Hansen M.D.   On: 02/29/2016 21:37    Procedures Procedures (including critical care time)  Medications Ordered in ED Medications  acetaminophen (TYLENOL) tablet 1,000 mg (not administered)  lidocaine (LIDODERM) 5 % 1 patch (not administered)     Initial Impression / Assessment and Plan / ED Course  I have reviewed the triage vital signs and the nursing notes.  Pertinent labs & imaging results that were available during my care of the patient were reviewed by me and considered in my medical decision making (see chart for details).  Clinical Course    Patient  presents to the ED for L forearm pain that radiates to into his upper arm and chest.  History is not consistent with ACS and troponin was not clinically indicated.  It was negative as well as his EKG.  Appears to be musculoskeletal pain.  He was given lidocaine patch and tylenol in the ED.  Advised to continue home medications and fu with PCP on 9/29 as scheduled.  He appears well and in NAD.  Vs remain within his normal limits and he is safe for DC.  Final Clinical Impressions(s) / ED Diagnoses   Final diagnoses:  None    New Prescriptions New Prescriptions   No medications on file     I personally performed the services described in this documentation, which was scribed in my presence. The recorded information has been reviewed and is accurate.      Tomasita Crumble, MD 03/01/16 [phone]

## 2016-03-01 NOTE — ED Notes (Signed)
Dr. Oni at bedside at this time.  

## 2017-11-09 ENCOUNTER — Ambulatory Visit (INDEPENDENT_AMBULATORY_CARE_PROVIDER_SITE_OTHER): Payer: Medicare Other

## 2017-11-09 ENCOUNTER — Ambulatory Visit (HOSPITAL_COMMUNITY)
Admission: EM | Admit: 2017-11-09 | Discharge: 2017-11-09 | Disposition: A | Discharge status: Home / Self Care | Payer: Medicare Other | Attending: Internal Medicine | Admitting: Internal Medicine

## 2017-11-09 ENCOUNTER — Encounter (HOSPITAL_COMMUNITY): Payer: Self-pay | Admitting: Family Medicine

## 2017-11-09 DIAGNOSIS — M79645 Pain in left finger(s): Secondary | ICD-10-CM

## 2017-11-09 DIAGNOSIS — S60032A Contusion of left middle finger without damage to nail, initial encounter: Secondary | ICD-10-CM | POA: Diagnosis not present

## 2017-11-09 NOTE — ED Provider Notes (Signed)
  MRN: [id number] DOB: 1976/01/17  Subjective:   John Mckenzie is a 42 y.o. male presenting for suffering left middle finger injury while getting off of pus.  Patient states that his distal portion of his left middle finger collided with a metal piece of the door.  He had some bleeding around the nail and has developed bruising.  Reports that it is pretty tender to touch.  He has not tried any medications for pain relief.  He does have a prescription for meloxicam but has not used this.  No current facility-administered medications for this encounter.   Current Outpatient Medications:  .  canagliflozin (INVOKANA) 300 MG TABS tablet, Take 300 mg by mouth daily before breakfast., Disp: , Rfl:  .  loratadine (CLARITIN) 10 MG tablet, Take 10 mg by mouth daily., Disp: , Rfl:  .  meloxicam (MOBIC) 15 MG tablet, Take 15 mg by mouth daily., Disp: , Rfl:  .  metFORMIN (GLUCOPHAGE) 1000 MG tablet, 1 TABLET BY MOUTH TWICE A DAY, Disp: , Rfl: 5 .  omeprazole (PRILOSEC) 20 MG capsule, Take 40 mg by mouth daily., Disp: , Rfl: 5 .  ondansetron (ZOFRAN) 4 MG tablet, Take 1 tablet (4 mg total) by mouth every 6 (six) hours. (Patient not taking: Reported on 03/01/2016), Disp: 12 tablet, Rfl: 0   No Known Allergies  Past Medical History:  Diagnosis Date  . Arthritis   . Diabetes mellitus without complication (HCC)   . Hypertension   . Seasonal allergies      History reviewed. No pertinent surgical history.  Objective:   Vitals: BP 111/78   Pulse 89   Temp 98.3 F (36.8 C)   Resp 18   SpO2 98%   Physical Exam  Constitutional: He is oriented to person, place, and time. He appears well-developed and well-nourished.  Cardiovascular: Normal rate.  Pulmonary/Chest: Effort normal.  Musculoskeletal:       Hands: Neurological: He is alert and oriented to person, place, and time.   Dg Finger Middle Left  Result Date: 11/09/2017 CLINICAL DATA:  42 year old who injured the LEFT long finger 3 days  ago. Persistent distal pain. Initial encounter. EXAM: LEFT MIDDLE FINGER 2+V COMPARISON:  None. FINDINGS: No evidence of acute fracture or dislocation. Joint spaces well preserved. Well-preserved bone mineral density. No intrinsic osseous abnormalities. IMPRESSION: Normal examination. Electronically Signed   By: Hulan Saashomas  Lawrence M.D.   On: 11/09/2017 14:51    Assessment and Plan :   Contusion of left middle finger without damage to nail, initial encounter  Pain of left middle finger  X-rays negative for fracture.  Will manage conservatively as a contusion.  Patient is to start meloxicam and return to clinic if symptoms fail to improve.   Wallis BambergMani, Kearsten Ginther, [place] JerseyPA-C 11/09/17 [id number]

## 2017-11-09 NOTE — Discharge Instructions (Signed)
Use meloxicam and be very careful with your finger.

## 2017-11-09 NOTE — ED Triage Notes (Signed)
Pt here for left middle finger injury. He reports he hit it getting off the bus Friday. Finger swollen and painful

## 2020-03-04 IMAGING — DX DG FINGER MIDDLE 2+V*L*
3 series · 3 of 3 positions shown · non-contrast
Comparison: None.

CLINICAL DATA: 42-year-old who injured the LEFT long finger 3 days
ago. Persistent distal pain. Initial encounter.

EXAM:
LEFT MIDDLE FINGER 2+V

[finger ap]
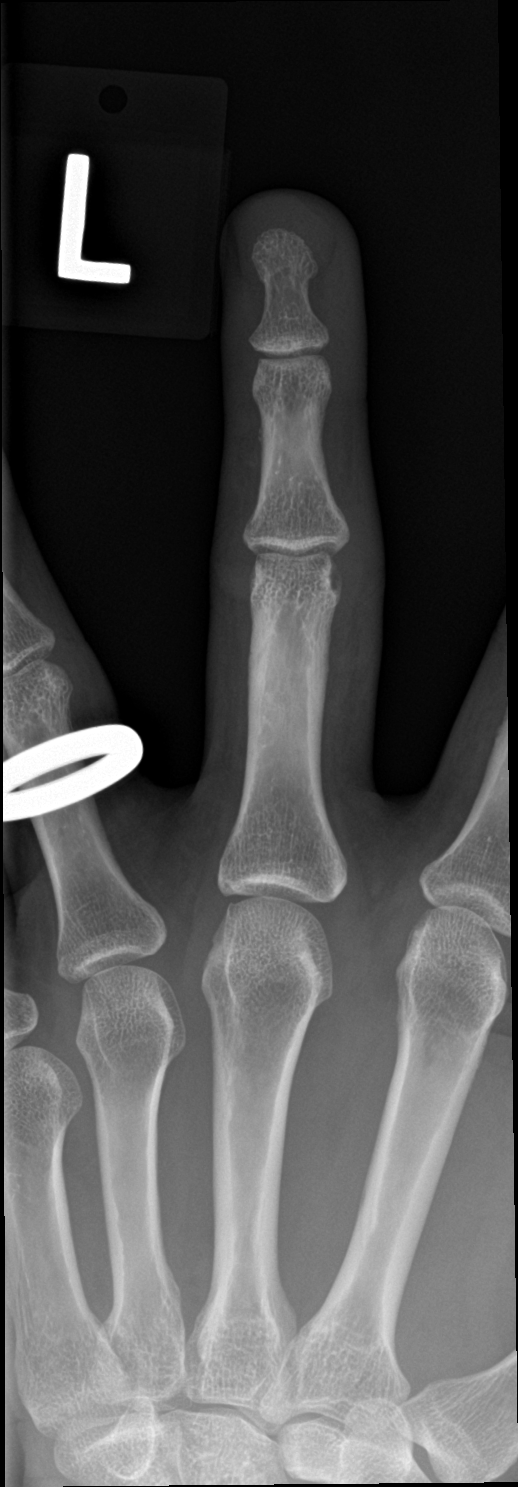

[finger obl]
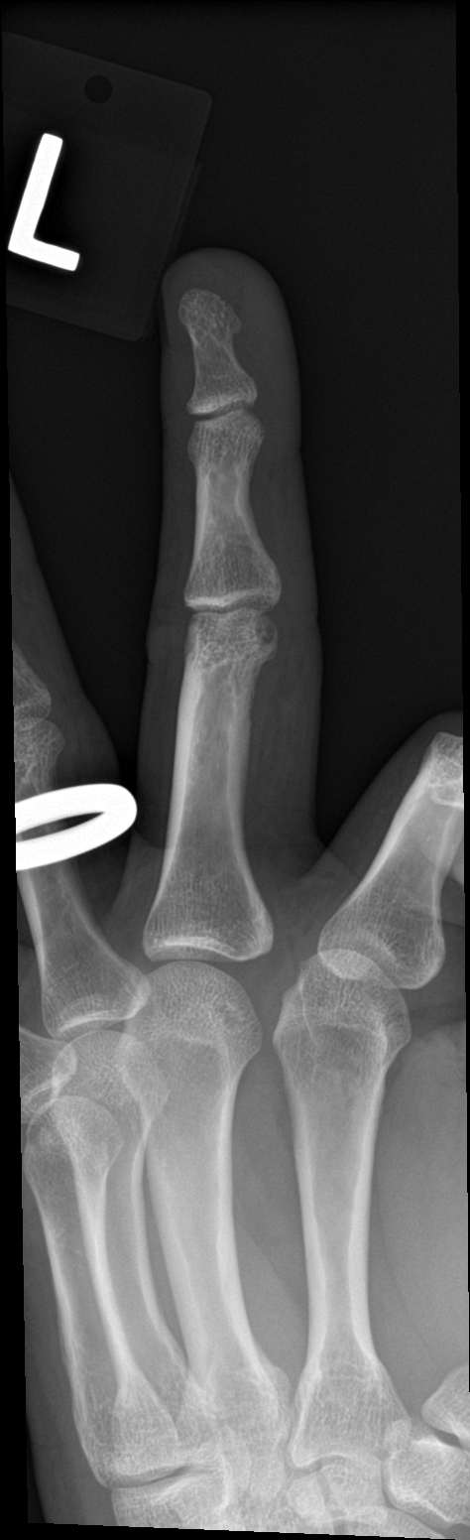

[finger lat]
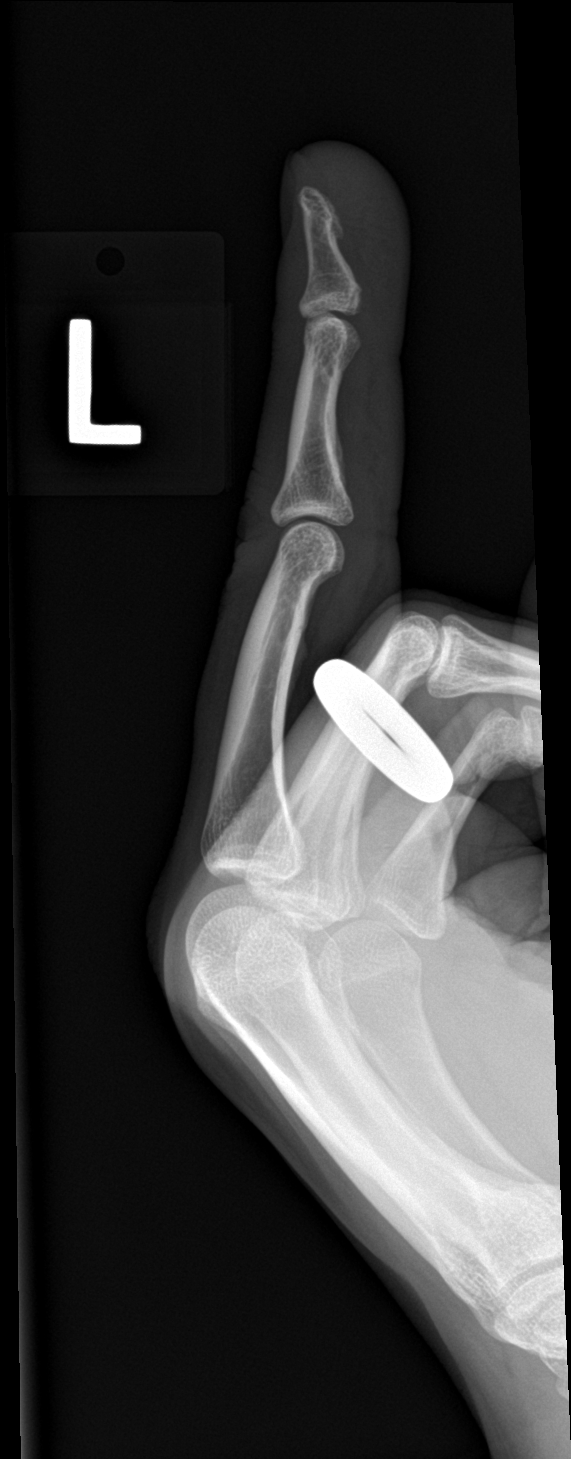

[3 of 3 positions shown; findings below may reference images not displayed]

FINDINGS: No evidence of acute fracture or dislocation. Joint spaces well
preserved. Well-preserved bone mineral density. No intrinsic osseous
abnormalities.
IMPRESSION: Normal examination.

## 2020-06-16 ENCOUNTER — Other Ambulatory Visit: Payer: Medicare Other

## 2020-08-10 ENCOUNTER — Other Ambulatory Visit: Payer: Self-pay | Admitting: Nurse Practitioner

## 2020-08-10 DIAGNOSIS — R3989 Other symptoms and signs involving the genitourinary system: Secondary | ICD-10-CM

## 2020-08-24 ENCOUNTER — Ambulatory Visit
Admission: RE | Admit: 2020-08-24 | Discharge: 2020-08-24 | Disposition: A | Discharge status: Home / Self Care | Payer: Medicare HMO | Source: Ambulatory Visit | Attending: Nurse Practitioner | Admitting: Nurse Practitioner

## 2020-08-24 DIAGNOSIS — R3989 Other symptoms and signs involving the genitourinary system: Secondary | ICD-10-CM

## 2021-06-10 HISTORY — PX: TESTICLE SURGERY: SHX794

## 2021-12-04 ENCOUNTER — Ambulatory Visit (INDEPENDENT_AMBULATORY_CARE_PROVIDER_SITE_OTHER): Payer: Medicare HMO | Admitting: Podiatry

## 2021-12-04 ENCOUNTER — Ambulatory Visit (INDEPENDENT_AMBULATORY_CARE_PROVIDER_SITE_OTHER): Payer: Medicare HMO

## 2021-12-04 ENCOUNTER — Encounter: Payer: Self-pay | Admitting: Podiatry

## 2021-12-04 DIAGNOSIS — B353 Tinea pedis: Secondary | ICD-10-CM | POA: Diagnosis not present

## 2021-12-04 DIAGNOSIS — E1142 Type 2 diabetes mellitus with diabetic polyneuropathy: Secondary | ICD-10-CM

## 2021-12-04 DIAGNOSIS — M722 Plantar fascial fibromatosis: Secondary | ICD-10-CM | POA: Diagnosis not present

## 2021-12-04 MED ORDER — KETOCONAZOLE 2 % EX CREA: 1.0000 | TOPICAL_CREAM | Freq: Every day | CUTANEOUS | 2 refills | Status: AC

## 2021-12-06 ENCOUNTER — Telehealth: Payer: Self-pay | Admitting: *Deleted

## 2022-01-15 ENCOUNTER — Encounter: Payer: Self-pay | Admitting: Podiatry

## 2022-01-15 ENCOUNTER — Ambulatory Visit (INDEPENDENT_AMBULATORY_CARE_PROVIDER_SITE_OTHER): Payer: Medicare HMO | Admitting: Podiatry

## 2022-01-15 DIAGNOSIS — E1142 Type 2 diabetes mellitus with diabetic polyneuropathy: Secondary | ICD-10-CM | POA: Diagnosis not present

## 2022-01-15 DIAGNOSIS — M79675 Pain in left toe(s): Secondary | ICD-10-CM

## 2022-01-15 DIAGNOSIS — B351 Tinea unguium: Secondary | ICD-10-CM | POA: Diagnosis not present

## 2022-01-15 DIAGNOSIS — M722 Plantar fascial fibromatosis: Secondary | ICD-10-CM | POA: Diagnosis not present

## 2022-01-15 DIAGNOSIS — M79674 Pain in right toe(s): Secondary | ICD-10-CM | POA: Diagnosis not present

## 2022-01-15 MED ORDER — DEXAMETHASONE SODIUM PHOSPHATE 120 MG/30ML IJ SOLN
4.0000 mg | Freq: Once | INTRAMUSCULAR | Status: AC
  Administered 2022-01-15: 4 mg via INTRA_ARTICULAR

## 2022-01-15 NOTE — Progress Notes (Signed)
  Subjective:  Patient ID: John Mckenzie, male    DOB: [birth year],   MRN: [id number]  Chief Complaint  Patient presents with   Plantar Fasciitis    Plantar fasciitis , patient states pain is the same since the last visit , patient states pain is there same sometimes. Patient states he has been doing his stretching and walking and wearing the PF braces . Pain is mostly at the heel .    46 y.o. male presents for follow-up of bilateral plantar fasciits and fibromas. Relates he is doing about the same. Has been stretching and wearing braces and pain is mostly in the heels. He is diabetic and his last A1c was unkown. Has tried different shoes with some relief. Denies any other pedal complaints. Denies n/v/f/c.   Past Medical History:  Diagnosis Date   Arthritis    Diabetes mellitus without complication (HCC)    Hypertension    Seasonal allergies     Objective:  Physical Exam: Vascular: DP/PT pulses 2/4 bilateral. CFT <3 seconds. Normal hair growth on digits. No edema.  Skin. No lacerations or abrasions bilateral feet. Scaling and erythema noted to plantar feet bilateral. Two small nodules noted in left plantar fascia non mobile and minorly tender. Nails 1-5 bilateral are thickened elongated and with subungual debris.  Musculoskeletal: MMT 5/5 bilateral lower extremities in DF, PF, Inversion and Eversion. Deceased ROM in DF of ankle joint. Tender to medial calcaneal tubercle bilateral. No pain along achilles PT or arch. Some pain around fibroma area on the right. No pain with calcaneal squeeze.  Neurological: Sensation intact to light touch.   Assessment:   1. Plantar fasciitis, bilateral   2. Plantar fascial fibromatosis   3. Diabetic polyneuropathy associated with type 2 diabetes mellitus (HCC)   4. Pain due to onychomycosis of toenails of both feet       Plan:  Patient was evaluated and treated and all questions answered. Discussed plantar fasciitis with patient.  X-rays  reviewed and discussed with patient. No acute fractures or dislocations noted. Mild spurring noted at inferior calcaneus.  Discussed treatment options including, ice, NSAIDS, supportive shoes, bracing, and stretching.  Continue Stretching. Continue with anti-inflammatories  Continue bracing.   Requesting injeciton today procedure below.  Requesting nail trim. Nails 1-5 bilateral debrided without incident wit nail nipper.  Follow-up 3 months for rfc.   Procedure: Injection Tendon/Ligament Discussed alternatives, risks, complications and verbal consent was obtained.  Location: Bilateral plantar fascia. Skin Prep: Alcohol. Injectate: 1cc 0.5% marcaine plain, 1 cc dexamethasone.  Disposition: Patient tolerated procedure well. Injection site dressed with a band-aid.  Post-injection care was discussed and return precautions discussed.       Louann Sjogren, DPM

## 2022-03-15 NOTE — Telephone Encounter (Signed)
error 

## 2022-04-24 ENCOUNTER — Ambulatory Visit (INDEPENDENT_AMBULATORY_CARE_PROVIDER_SITE_OTHER): Payer: Medicare HMO | Admitting: Podiatry

## 2022-04-24 ENCOUNTER — Encounter: Payer: Self-pay | Admitting: Podiatry

## 2022-04-24 DIAGNOSIS — M722 Plantar fascial fibromatosis: Secondary | ICD-10-CM | POA: Diagnosis not present

## 2022-04-24 DIAGNOSIS — M79675 Pain in left toe(s): Secondary | ICD-10-CM | POA: Diagnosis not present

## 2022-04-24 DIAGNOSIS — M79674 Pain in right toe(s): Secondary | ICD-10-CM

## 2022-04-24 DIAGNOSIS — B351 Tinea unguium: Secondary | ICD-10-CM | POA: Diagnosis not present

## 2022-04-24 DIAGNOSIS — E1142 Type 2 diabetes mellitus with diabetic polyneuropathy: Secondary | ICD-10-CM

## 2022-04-24 NOTE — Progress Notes (Signed)
This patient presents to the office with chief complaint of long thick painful nails.  Patient says the nails are painful walking and wearing shoes.  This patient is unable to self treat.  This patient is unable to trim his nails since she is unable to reach his nails.  She presents to the office for preventative foot care services.  General Appearance  Alert, conversant and in no acute stress.  Vascular  Dorsalis pedis and posterior tibial  pulses are palpable  bilaterally.  Capillary return is within normal limits  bilaterally. Temperature is within normal limits  bilaterally.  Neurologic  Senn-Weinstein monofilament wire test within normal limits  bilaterally. Muscle power within normal limits bilaterally.  Nails Thick disfigured discolored nails with subungual debris  hallux nails bilaterally. No evidence of bacterial infection or drainage bilaterally.  Orthopedic  No limitations of motion  feet .  No crepitus or effusions noted.  No bony pathology or digital deformities noted. Plantar fibroma left foot.  Skin  normotropic skin with no porokeratosis noted bilaterally.  No signs of infections or ulcers noted.     Onychomycosis  Nails  B/L.  Pain in right toes  Pain in left toes  Debridement of nails both feet followed trimming the nails with dremel tool.    RTC 3 months.   Helane Gunther DPM

## 2022-07-04 ENCOUNTER — Other Ambulatory Visit: Payer: Self-pay | Admitting: Student

## 2022-07-04 DIAGNOSIS — M79606 Pain in leg, unspecified: Secondary | ICD-10-CM

## 2022-07-28 ENCOUNTER — Other Ambulatory Visit: Payer: Medicare Other

## 2022-08-07 ENCOUNTER — Encounter: Payer: Self-pay | Admitting: Podiatry

## 2022-08-07 ENCOUNTER — Ambulatory Visit (INDEPENDENT_AMBULATORY_CARE_PROVIDER_SITE_OTHER): Payer: Medicare HMO | Admitting: Podiatry

## 2022-08-07 DIAGNOSIS — E1142 Type 2 diabetes mellitus with diabetic polyneuropathy: Secondary | ICD-10-CM

## 2022-08-07 DIAGNOSIS — B351 Tinea unguium: Secondary | ICD-10-CM | POA: Diagnosis not present

## 2022-08-07 DIAGNOSIS — M722 Plantar fascial fibromatosis: Secondary | ICD-10-CM | POA: Diagnosis not present

## 2022-08-07 DIAGNOSIS — M79675 Pain in left toe(s): Secondary | ICD-10-CM | POA: Diagnosis not present

## 2022-08-07 DIAGNOSIS — M79674 Pain in right toe(s): Secondary | ICD-10-CM

## 2022-08-07 NOTE — Progress Notes (Signed)
This patient presents to the office with chief complaint of long thick painful nails.  Patient says the nails are painful walking and wearing shoes.  This patient is unable to self treat.  This patient is unable to trim his  nails since he is unable to reach her nails.  She presents to the office for preventative foot care services.  General Appearance  Alert, conversant and in no acute stress.  Vascular  Dorsalis pedis and posterior tibial  pulses are palpable  bilaterally.  Capillary return is within normal limits  bilaterally. Temperature is within normal limits  bilaterally.  Neurologic  Senn-Weinstein monofilament wire test within normal limits  bilaterally. Muscle power within normal limits bilaterally.  Nails Thick disfigured discolored nails with subungual debris  hallux  bilaterally. No evidence of bacterial infection or drainage bilaterally.  Orthopedic  No limitations of motion  feet .  No crepitus or effusions noted.  No bony pathology or digital deformities noted. Plantar fibroma left foot.  Skin  normotropic skin with no porokeratosis noted bilaterally.  No signs of infections or ulcers noted.     Onychomycosis  Nails  B/L.  Pain in right toes  Pain in left toes  Debridement of nails both feet followed trimming the nails with dremel tool.    RTC 3 months.   Gardiner Barefoot DPM

## 2022-09-26 ENCOUNTER — Ambulatory Visit
Admission: RE | Admit: 2022-09-26 | Discharge: 2022-09-26 | Disposition: A | Discharge status: Home / Self Care | Payer: Medicare HMO | Source: Ambulatory Visit | Attending: Registered Nurse | Admitting: Registered Nurse

## 2022-09-26 ENCOUNTER — Other Ambulatory Visit: Payer: Self-pay | Admitting: Registered Nurse

## 2022-09-26 DIAGNOSIS — M549 Dorsalgia, unspecified: Secondary | ICD-10-CM

## 2022-11-07 ENCOUNTER — Ambulatory Visit: Payer: Medicare HMO | Admitting: Podiatry

## 2022-11-27 ENCOUNTER — Ambulatory Visit (INDEPENDENT_AMBULATORY_CARE_PROVIDER_SITE_OTHER): Payer: Medicare HMO | Admitting: Podiatry

## 2022-11-27 ENCOUNTER — Encounter: Payer: Self-pay | Admitting: Podiatry

## 2022-11-27 VITALS — BP 113/74

## 2022-11-27 DIAGNOSIS — B351 Tinea unguium: Secondary | ICD-10-CM | POA: Diagnosis not present

## 2022-11-27 DIAGNOSIS — M79674 Pain in right toe(s): Secondary | ICD-10-CM

## 2022-11-27 DIAGNOSIS — E1142 Type 2 diabetes mellitus with diabetic polyneuropathy: Secondary | ICD-10-CM | POA: Diagnosis not present

## 2022-11-27 DIAGNOSIS — M79675 Pain in left toe(s): Secondary | ICD-10-CM

## 2022-11-27 NOTE — Progress Notes (Signed)
This patient presents to the office with chief complaint of long thick painful nails.  Patient says the nails are painful walking and wearing shoes.  This patient is unable to self treat.  This patient is unable to trim his  nails since he is unable to reach her nails.  he presents to the office for preventative foot care services.  General Appearance  Alert, conversant and in no acute stress.  Vascular  Dorsalis pedis and posterior tibial  pulses are palpable  bilaterally.  Capillary return is within normal limits  bilaterally. Temperature is within normal limits  bilaterally.  Neurologic  Senn-Weinstein monofilament wire test within normal limits  bilaterally. Muscle power within normal limits bilaterally.  Nails Thick disfigured discolored nails with subungual debris  hallux  bilaterally. No evidence of bacterial infection or drainage bilaterally.  Orthopedic  No limitations of motion  feet .  No crepitus or effusions noted.  No bony pathology or digital deformities noted. Plantar fibroma left foot.  Skin  normotropic skin with no porokeratosis noted bilaterally.  No signs of infections or ulcers noted.     Onychomycosis  Nails  B/L.  Pain in right toes  Pain in left toes  Debridement of nails both feet followed trimming the nails with dremel tool.    RTC 3 months.   Helane Gunther DPM

## 2022-12-18 IMAGING — US US SCROTUM W/ DOPPLER COMPLETE
1 series · 14 of 25 positions shown · non-contrast
Comparison: None.

CLINICAL DATA: Enlarged scrotal sac

EXAM:
SCROTAL ULTRASOUND
DOPPLER ULTRASOUND OF THE TESTICLES
TECHNIQUE: Complete ultrasound examination of the testicles, epididymis, and
other scrotal structures was performed. Color and spectral Doppler
ultrasound were also utilized to evaluate blood flow to the
testicles.

[Series 1: us scrotum w/ doppler complete · 14 of 42 slices shown]
[im 1/42]
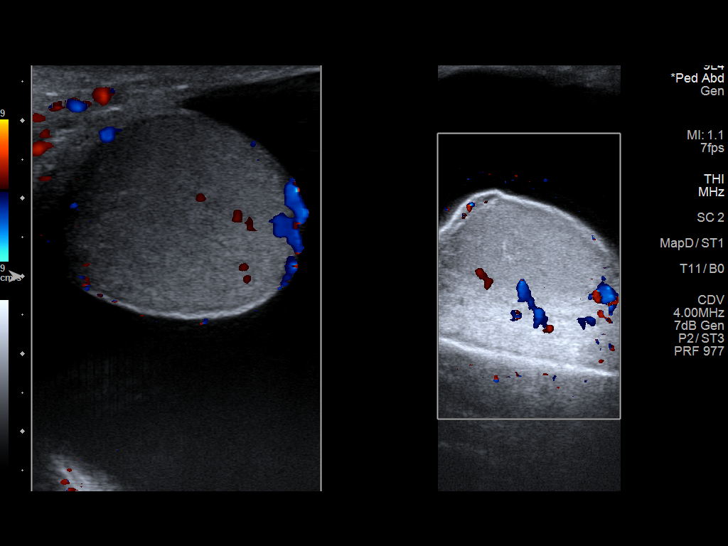
[im 4/42]
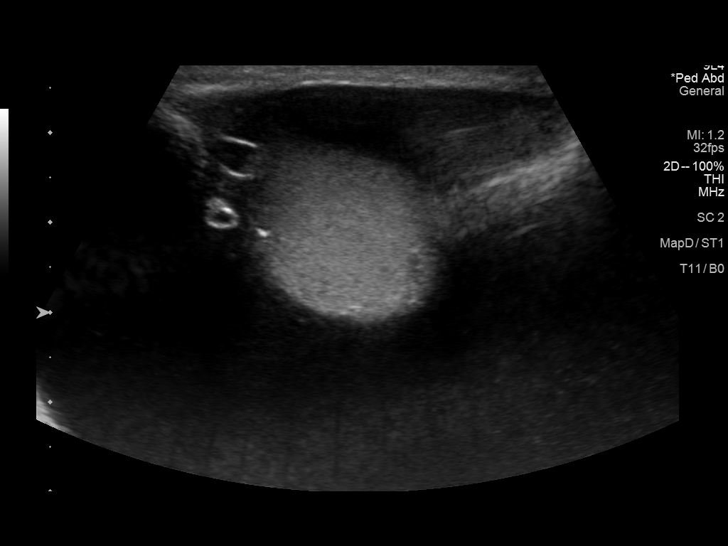
[im 7/42]
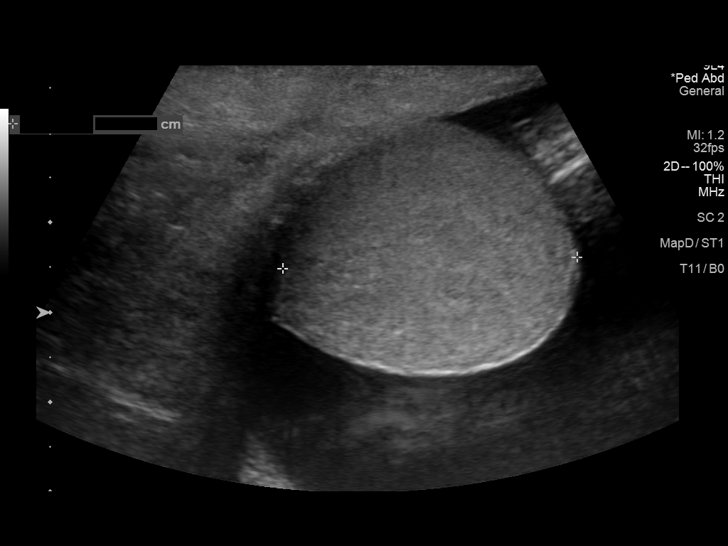
[im 11/42]
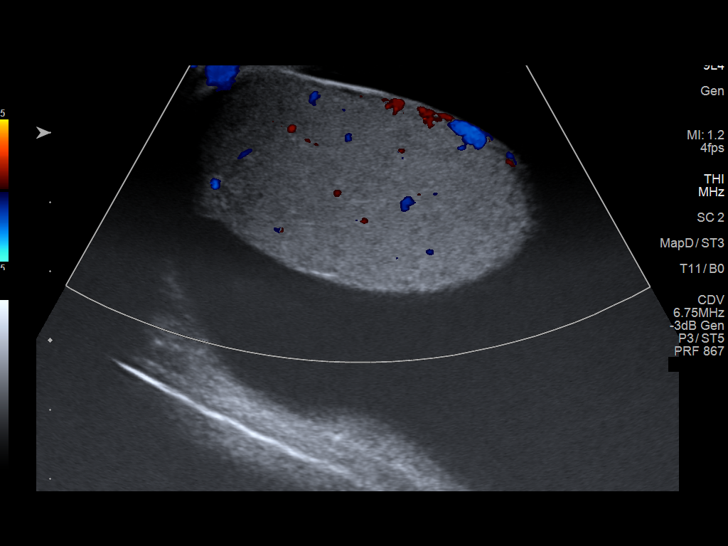
[im 14/42]
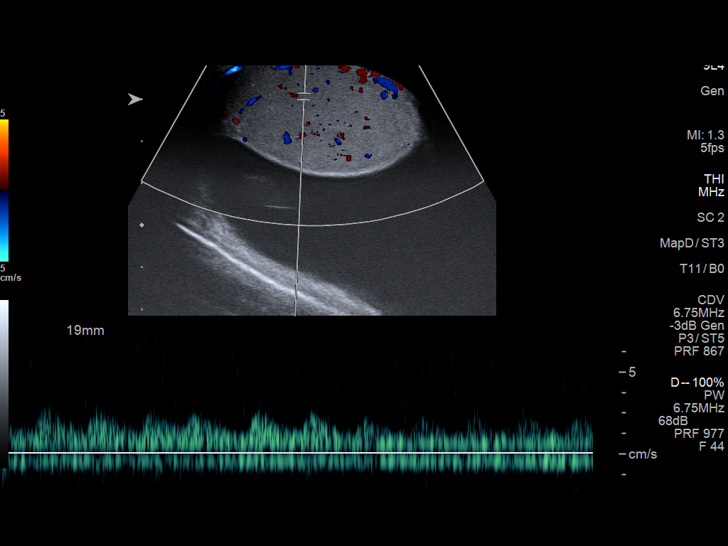
[im 16/42]
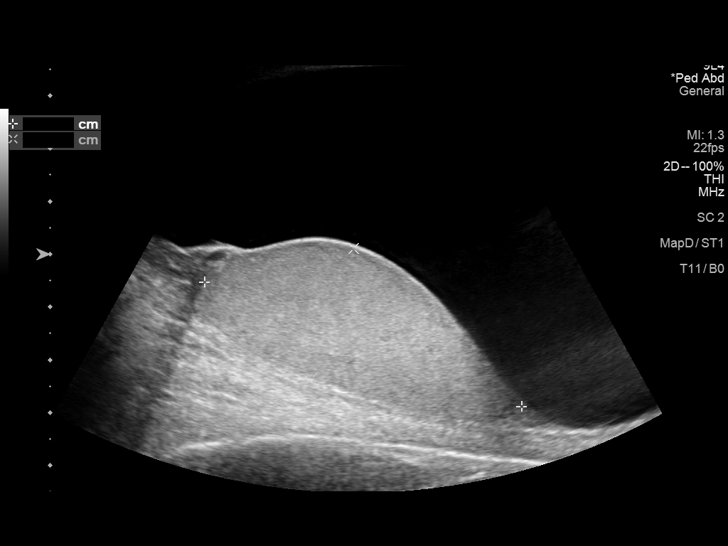
[im 19/42]
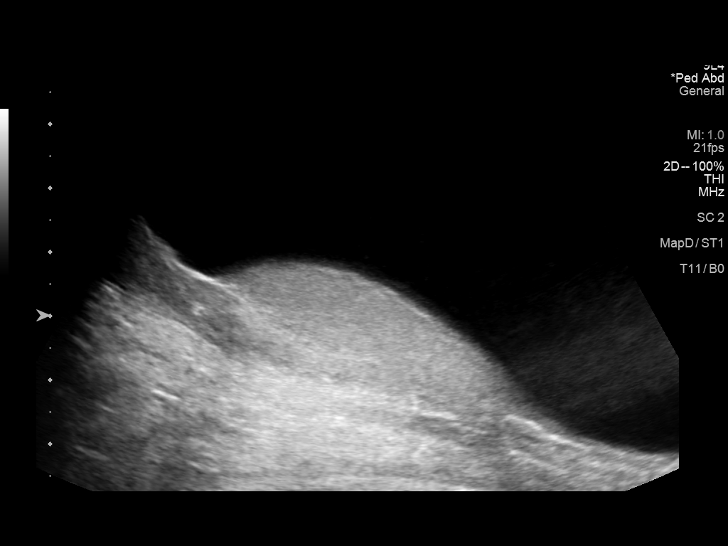
[im 23/42]
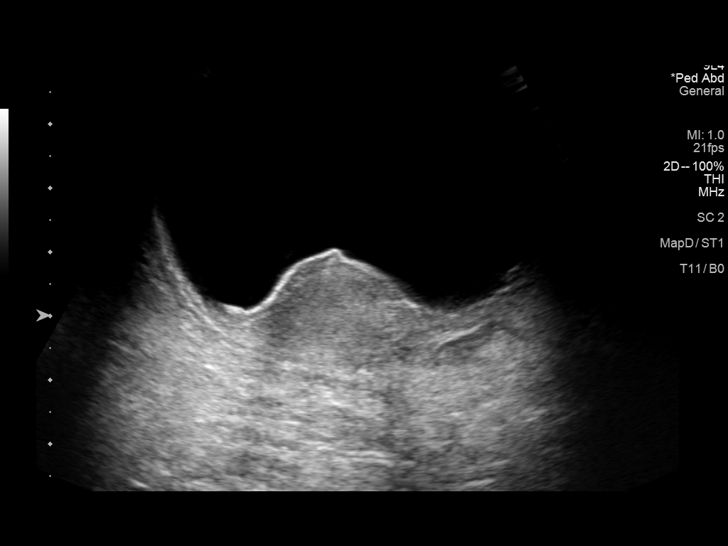
[im 26/42]
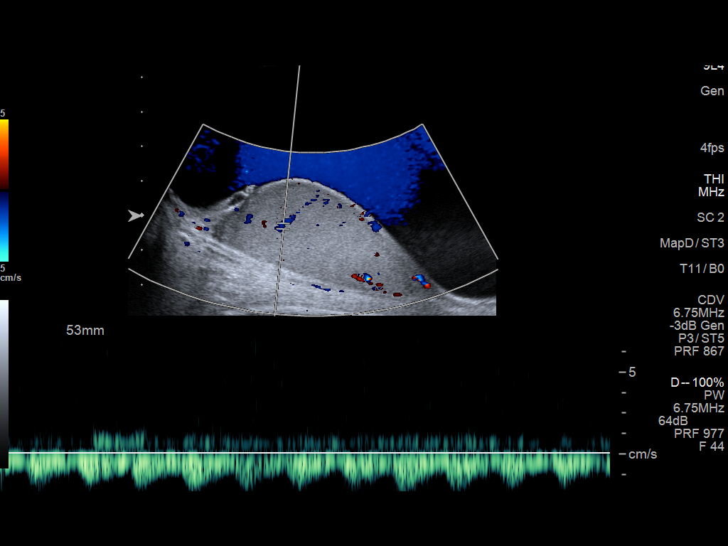
[im 28/42]
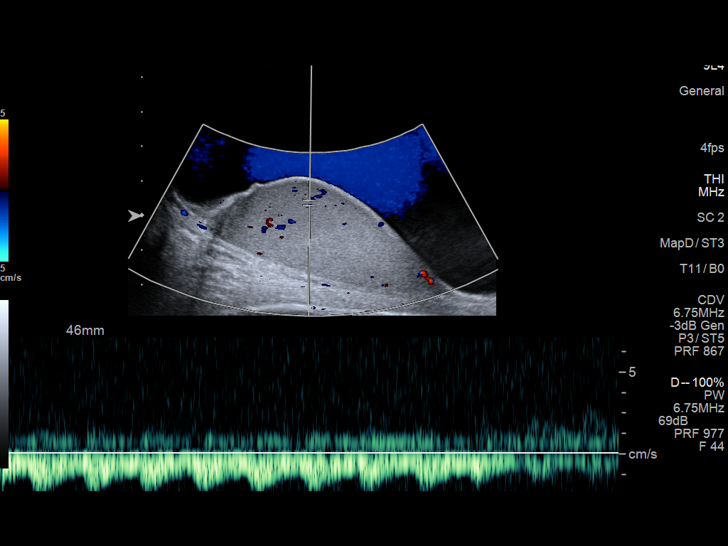
[im 31/42]
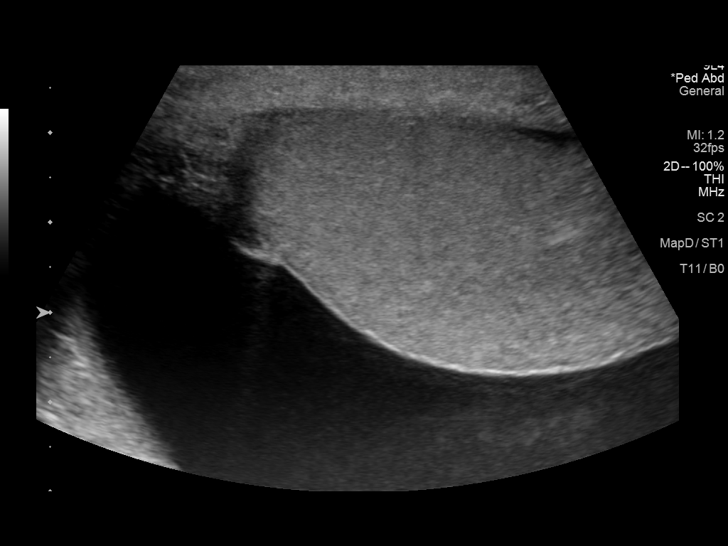
[im 35/42]
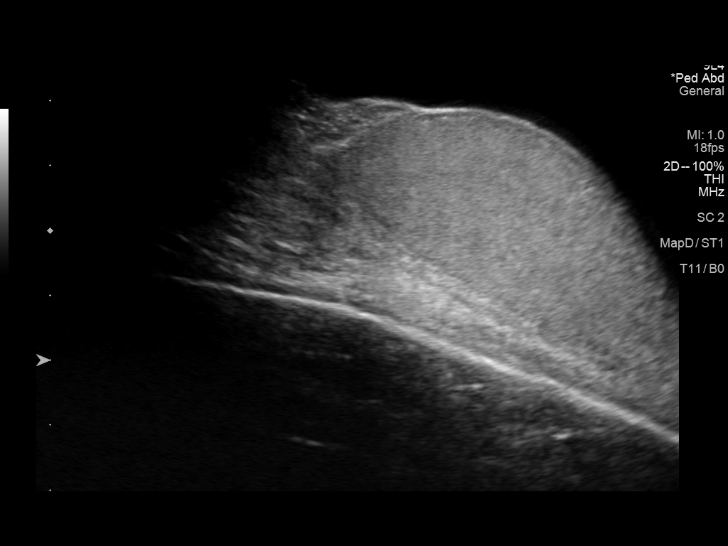
[im 38/42]
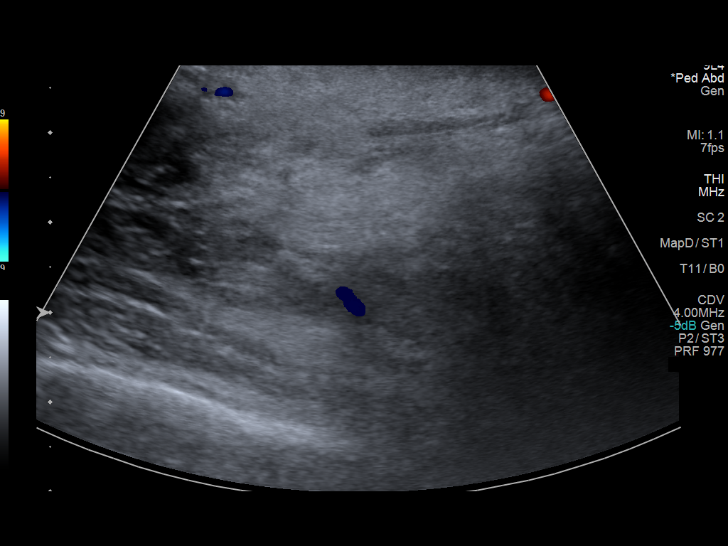
[im 42/42]
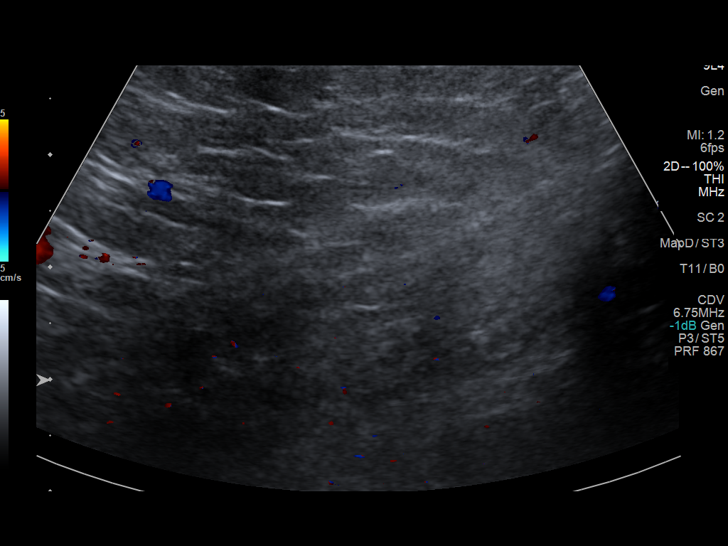

[14 of 25 positions shown; findings below may reference images not displayed]

FINDINGS: Right testicle

Measurements: 5.1 x 2.6 x 3.3 cm. No mass or microlithiasis
visualized.

Left testicle

Measurements: 6.4 x 2.6 x 3.5 cm. No mass or microlithiasis
visualized.

Right epididymis:  Normal in size and appearance.

Left epididymis:  Normal in size and appearance.

Hydrocele:  Moderate to large bilateral hydroceles identified.

Varicocele:  None visualized.

Pulsed Doppler interrogation of both testes demonstrates normal low
resistance arterial and venous waveforms bilaterally.
IMPRESSION: 1. Moderate to large bilateral hydroceles.
2. No other abnormalities.

## 2023-02-27 ENCOUNTER — Ambulatory Visit: Payer: Medicare HMO | Admitting: Podiatry

## 2023-08-04 ENCOUNTER — Other Ambulatory Visit: Payer: Self-pay | Admitting: Registered Nurse

## 2023-08-04 ENCOUNTER — Ambulatory Visit
Admission: RE | Admit: 2023-08-04 | Discharge: 2023-08-04 | Disposition: A | Discharge status: Home / Self Care | Payer: 59 | Source: Ambulatory Visit | Attending: Registered Nurse

## 2023-08-04 DIAGNOSIS — G8929 Other chronic pain: Secondary | ICD-10-CM

## 2024-01-09 ENCOUNTER — Other Ambulatory Visit: Payer: Self-pay | Admitting: Registered Nurse

## 2024-01-09 DIAGNOSIS — N181 Chronic kidney disease, stage 1: Secondary | ICD-10-CM

## 2024-01-12 ENCOUNTER — Encounter: Payer: Self-pay | Admitting: Internal Medicine

## 2024-01-12 ENCOUNTER — Ambulatory Visit
Admission: RE | Admit: 2024-01-12 | Discharge: 2024-01-12 | Disposition: A | Discharge status: Home / Self Care | Source: Ambulatory Visit | Attending: Registered Nurse | Admitting: Registered Nurse

## 2024-01-12 DIAGNOSIS — N181 Chronic kidney disease, stage 1: Secondary | ICD-10-CM

## 2024-01-30 ENCOUNTER — Ambulatory Visit (AMBULATORY_SURGERY_CENTER)

## 2024-01-30 ENCOUNTER — Other Ambulatory Visit: Payer: Self-pay

## 2024-01-30 VITALS — Ht 71.0 in | Wt 198.0 lb

## 2024-01-30 DIAGNOSIS — K625 Hemorrhage of anus and rectum: Secondary | ICD-10-CM

## 2024-01-30 MED ORDER — NA SULFATE-K SULFATE-MG SULF 17.5-3.13-1.6 GM/177ML PO SOLN: 1.0000 | Freq: Once | ORAL | 0 refills | Status: AC

## 2024-01-30 NOTE — Progress Notes (Signed)
 Denies allergies to eggs or soy products. Denies complication of anesthesia or sedation. Denies use of weight loss medication. Denies use of O2.   Emmi instructions given for colonoscopy.

## 2024-02-02 ENCOUNTER — Encounter: Payer: Self-pay | Admitting: Internal Medicine

## 2024-02-17 ENCOUNTER — Ambulatory Visit (AMBULATORY_SURGERY_CENTER): Admitting: Internal Medicine

## 2024-02-17 ENCOUNTER — Encounter: Payer: Self-pay | Admitting: Internal Medicine

## 2024-02-17 VITALS — BP 117/81 | HR 80 | Temp 98.4°F | Resp 16 | Ht 71.0 in | Wt 198.0 lb

## 2024-02-17 DIAGNOSIS — D122 Benign neoplasm of ascending colon: Secondary | ICD-10-CM

## 2024-02-17 DIAGNOSIS — Z1211 Encounter for screening for malignant neoplasm of colon: Secondary | ICD-10-CM | POA: Diagnosis not present

## 2024-02-17 DIAGNOSIS — K648 Other hemorrhoids: Secondary | ICD-10-CM | POA: Diagnosis not present

## 2024-02-17 DIAGNOSIS — D120 Benign neoplasm of cecum: Secondary | ICD-10-CM | POA: Diagnosis not present

## 2024-02-17 DIAGNOSIS — K625 Hemorrhage of anus and rectum: Secondary | ICD-10-CM

## 2024-02-17 MED ORDER — SODIUM CHLORIDE 0.9 % IV SOLN: 500.0000 mL | Freq: Once | INTRAVENOUS | Status: DC

## 2024-02-17 NOTE — Progress Notes (Signed)
 HISTORY OF PRESENT ILLNESS:  John Mckenzie is a 48 y.o. male sent directly for screening colonoscopy.  No complaints  REVIEW OF SYSTEMS:  All non-GI ROS negative except for  Past Medical History:  Diagnosis Date   Allergy    Anxiety    Arthritis    Diabetes mellitus without complication (HCC)    Hyperlipidemia    Hypertension    Seasonal allergies     Past Surgical History:  Procedure Laterality Date   TESTICLE SURGERY Bilateral 2023    Social History John Mckenzie  reports that he has quit smoking. He has never used smokeless tobacco. He reports that he does not drink alcohol and does not use drugs.  family history includes Cancer in an other family member; Hyperlipidemia in an other family member; Hypertension in an other family member; Rashes / Skin problems in an other family member.  No Known Allergies     PHYSICAL EXAMINATION: Vital signs: BP 120/88   Pulse 84   Temp 98.4 F (36.9 C) (Temporal)   Ht 5' 11 (1.803 m)   Wt 198 lb (89.8 kg)   SpO2 97%   BMI 27.62 kg/m  General: Well-developed, well-nourished, no acute distress HEENT: Sclerae are anicteric, conjunctiva pink. Oral mucosa intact Lungs: Clear Heart: Regular Abdomen: soft, nontender, nondistended, no obvious ascites, no peritoneal signs, normal bowel sounds. No organomegaly. Extremities: No edema Psychiatric: alert and oriented x3. Cooperative     ASSESSMENT:  Colon cancer screening   PLAN:  Screening colonoscopy

## 2024-02-17 NOTE — Progress Notes (Signed)
 Called to room to assist during endoscopic procedure.  Patient ID and intended procedure confirmed with present staff. Received instructions for my participation in the procedure from the performing physician.

## 2024-02-17 NOTE — Progress Notes (Signed)
 Updated medical record with pt and mother

## 2024-02-17 NOTE — Op Note (Signed)
 [facility] Patient Name: John Mckenzie Procedure Date: 02/17/2024 10:22 AM MRN: [id number] Endoscopist: Norleen SAILOR. Abran , MD, [id number] Age: 48 Referring MD:  Date of Birth: 04/02/1976 Gender: Male Account #: [id number] Procedure:                Colonoscopy Indications:              Screening for colorectal malignant neoplasm Medicines:                Monitored Anesthesia Care Procedure:                Pre-Anesthesia Assessment:                           - Prior to the procedure, a History and Physical                            was performed, and patient medications and                            allergies were reviewed. The patient's tolerance of                            previous anesthesia was also reviewed. The risks                            and benefits of the procedure and the sedation                            options and risks were discussed with the patient.                            All questions were answered, and informed consent                            was obtained. Prior Anticoagulants: The patient has                            taken no anticoagulant or antiplatelet agents. ASA                            Grade Assessment: II - A patient with mild systemic                            disease. After reviewing the risks and benefits,                            the patient was deemed in satisfactory condition to                            undergo the procedure.                           After obtaining informed consent, the colonoscope  was passed under direct vision. Throughout the                            procedure, the patient's blood pressure, pulse, and                            oxygen saturations were monitored continuously. The                            Olympus Scope SN: [id number] was introduced through                            the anus and advanced to the the cecum, identified                            by  appendiceal orifice and ileocecal valve. The                            ileocecal valve, appendiceal orifice, and rectum                            were photographed. The quality of the bowel                            preparation was excellent. The colonoscopy was                            performed without difficulty. The patient tolerated                            the procedure well. The bowel preparation used was                            SUPREP via split dose instruction. Scope In: 10:41:43 AM Scope Out: 10:54:35 AM Scope Withdrawal Time: 0 hours 10 minutes 24 seconds  Total Procedure Duration: 0 hours 12 minutes 52 seconds  Findings:                 Two polyps were found in the ascending colon and                            cecum. The polyps were 1 to 3 mm in size. These                            polyps were removed with a cold snare. Resection                            and retrieval were complete.                           Internal hemorrhoids were found during retroflexion.                           The exam was otherwise without  abnormality on                            direct and retroflexion views. Complications:            No immediate complications. Estimated blood loss:                            None. Estimated Blood Loss:     Estimated blood loss: none. Impression:               - Two 1 to 3 mm polyps in the ascending colon and                            in the cecum, removed with a cold snare. Resected                            and retrieved.                           - Internal hemorrhoids.                           - The examination was otherwise normal on direct                            and retroflexion views. Recommendation:           - Repeat colonoscopy in 7-10 years for surveillance.                           - Patient has a contact number available for                            emergencies. The signs and symptoms of potential                             delayed complications were discussed with the                            patient. Return to normal activities tomorrow.                            Written discharge instructions were provided to the                            patient.                           - Resume previous diet.                           - Continue present medications.                           - Await pathology results. Norleen SAILOR. Abran, MD 02/17/2024 10:59:24 AM This report has been signed electronically.

## 2024-02-17 NOTE — Patient Instructions (Signed)

## 2024-02-17 NOTE — Progress Notes (Signed)
 A/o x 3, VSS, gd SR's, pleased with anesthesia, report to RN

## 2024-02-18 ENCOUNTER — Telehealth: Payer: Self-pay | Admitting: *Deleted

## 2024-02-18 NOTE — Telephone Encounter (Signed)
  Follow up Call-     02/17/2024    9:58 AM  Call back number  Post procedure Call Back phone  # [phone]  Permission to leave phone message Yes     Patient questions:  Do you have a fever, pain , or abdominal swelling? No. Pain Score  0 *  Have you tolerated food without any problems? Yes.    Have you been able to return to your normal activities? Yes.    Do you have any questions about your discharge instructions: Diet   No. Medications  No. Follow up visit  No.  Do you have questions or concerns about your Care? No.  Actions: * If pain score is 4 or above: No action needed, pain <4.

## 2024-02-19 ENCOUNTER — Ambulatory Visit: Payer: Self-pay | Admitting: Internal Medicine

## 2024-02-19 LAB — SURGICAL PATHOLOGY
